# Patient Record
Sex: Male | Born: 1945
Health system: Southern US, Community
[De-identification: ages and names within clinical notes are randomized; demographics above are authoritative.]

## PROBLEM LIST (undated history)

## (undated) DIAGNOSIS — K858 Other acute pancreatitis without necrosis or infection: Secondary | ICD-10-CM

## (undated) DIAGNOSIS — E7800 Pure hypercholesterolemia, unspecified: Secondary | ICD-10-CM

## (undated) DIAGNOSIS — I700 Atherosclerosis of aorta: Secondary | ICD-10-CM

## (undated) DIAGNOSIS — I10 Essential (primary) hypertension: Secondary | ICD-10-CM

## (undated) DIAGNOSIS — N529 Male erectile dysfunction, unspecified: Secondary | ICD-10-CM

## (undated) DIAGNOSIS — M1990 Unspecified osteoarthritis, unspecified site: Secondary | ICD-10-CM

## (undated) DIAGNOSIS — I2510 Atherosclerotic heart disease of native coronary artery without angina pectoris: Secondary | ICD-10-CM

## (undated) DIAGNOSIS — R7303 Prediabetes: Secondary | ICD-10-CM

## (undated) HISTORY — DX: Atherosclerotic heart disease of native coronary artery without angina pectoris: I25.10

## (undated) HISTORY — DX: Other acute pancreatitis without necrosis or infection: K85.80

## (undated) HISTORY — DX: Prediabetes: R73.03

## (undated) HISTORY — DX: Essential (primary) hypertension: I10

## (undated) HISTORY — DX: Atherosclerosis of aorta: I70.0

## (undated) HISTORY — DX: Pure hypercholesterolemia, unspecified: E78.00

## (undated) HISTORY — DX: Male erectile dysfunction, unspecified: N52.9

## (undated) HISTORY — PX: HERNIA REPAIR: SHX51

---

## 2015-07-15 DIAGNOSIS — E782 Mixed hyperlipidemia: Secondary | ICD-10-CM | POA: Diagnosis not present

## 2015-07-15 DIAGNOSIS — Z0000 Encounter for general adult medical examination without abnormal findings: Secondary | ICD-10-CM | POA: Diagnosis not present

## 2015-07-15 DIAGNOSIS — R7301 Impaired fasting glucose: Secondary | ICD-10-CM | POA: Diagnosis not present

## 2015-07-15 DIAGNOSIS — I10 Essential (primary) hypertension: Secondary | ICD-10-CM | POA: Diagnosis not present

## 2015-07-19 DIAGNOSIS — Z1211 Encounter for screening for malignant neoplasm of colon: Secondary | ICD-10-CM | POA: Diagnosis not present

## 2015-09-02 DIAGNOSIS — R740 Nonspecific elevation of levels of transaminase and lactic acid dehydrogenase [LDH]: Secondary | ICD-10-CM | POA: Diagnosis not present

## 2015-09-14 DIAGNOSIS — R945 Abnormal results of liver function studies: Secondary | ICD-10-CM | POA: Diagnosis not present

## 2016-01-21 DIAGNOSIS — E782 Mixed hyperlipidemia: Secondary | ICD-10-CM | POA: Diagnosis not present

## 2016-01-21 DIAGNOSIS — R7303 Prediabetes: Secondary | ICD-10-CM | POA: Diagnosis not present

## 2016-01-21 DIAGNOSIS — I10 Essential (primary) hypertension: Secondary | ICD-10-CM | POA: Diagnosis not present

## 2016-02-28 DIAGNOSIS — Z01818 Encounter for other preprocedural examination: Secondary | ICD-10-CM | POA: Diagnosis not present

## 2016-03-30 DIAGNOSIS — Z86010 Personal history of colonic polyps: Secondary | ICD-10-CM | POA: Diagnosis not present

## 2016-03-30 DIAGNOSIS — K648 Other hemorrhoids: Secondary | ICD-10-CM | POA: Diagnosis not present

## 2016-03-30 DIAGNOSIS — Z79899 Other long term (current) drug therapy: Secondary | ICD-10-CM | POA: Diagnosis not present

## 2016-03-30 DIAGNOSIS — Z1211 Encounter for screening for malignant neoplasm of colon: Secondary | ICD-10-CM | POA: Diagnosis not present

## 2016-03-30 DIAGNOSIS — D124 Benign neoplasm of descending colon: Secondary | ICD-10-CM | POA: Diagnosis not present

## 2016-03-30 DIAGNOSIS — Z7982 Long term (current) use of aspirin: Secondary | ICD-10-CM | POA: Diagnosis not present

## 2016-03-30 DIAGNOSIS — I10 Essential (primary) hypertension: Secondary | ICD-10-CM | POA: Diagnosis not present

## 2016-03-30 DIAGNOSIS — Z800 Family history of malignant neoplasm of digestive organs: Secondary | ICD-10-CM | POA: Diagnosis not present

## 2016-04-03 DIAGNOSIS — J018 Other acute sinusitis: Secondary | ICD-10-CM | POA: Diagnosis not present

## 2016-08-15 DIAGNOSIS — Z0000 Encounter for general adult medical examination without abnormal findings: Secondary | ICD-10-CM | POA: Diagnosis not present

## 2016-08-15 DIAGNOSIS — Z125 Encounter for screening for malignant neoplasm of prostate: Secondary | ICD-10-CM | POA: Diagnosis not present

## 2016-08-15 DIAGNOSIS — Z23 Encounter for immunization: Secondary | ICD-10-CM | POA: Diagnosis not present

## 2016-08-15 DIAGNOSIS — Z6826 Body mass index (BMI) 26.0-26.9, adult: Secondary | ICD-10-CM | POA: Diagnosis not present

## 2016-12-06 DIAGNOSIS — M25512 Pain in left shoulder: Secondary | ICD-10-CM | POA: Diagnosis not present

## 2016-12-06 DIAGNOSIS — J018 Other acute sinusitis: Secondary | ICD-10-CM | POA: Diagnosis not present

## 2017-02-13 DIAGNOSIS — I10 Essential (primary) hypertension: Secondary | ICD-10-CM | POA: Diagnosis not present

## 2017-02-13 DIAGNOSIS — E782 Mixed hyperlipidemia: Secondary | ICD-10-CM | POA: Diagnosis not present

## 2017-02-13 DIAGNOSIS — R7303 Prediabetes: Secondary | ICD-10-CM | POA: Diagnosis not present

## 2017-03-27 DIAGNOSIS — J060 Acute laryngopharyngitis: Secondary | ICD-10-CM | POA: Diagnosis not present

## 2017-07-04 DIAGNOSIS — J060 Acute laryngopharyngitis: Secondary | ICD-10-CM | POA: Diagnosis not present

## 2017-08-20 DIAGNOSIS — R7303 Prediabetes: Secondary | ICD-10-CM | POA: Diagnosis not present

## 2017-08-20 DIAGNOSIS — Z6826 Body mass index (BMI) 26.0-26.9, adult: Secondary | ICD-10-CM | POA: Diagnosis not present

## 2017-08-20 DIAGNOSIS — E782 Mixed hyperlipidemia: Secondary | ICD-10-CM | POA: Diagnosis not present

## 2017-08-20 DIAGNOSIS — Z0000 Encounter for general adult medical examination without abnormal findings: Secondary | ICD-10-CM | POA: Diagnosis not present

## 2017-08-20 DIAGNOSIS — I10 Essential (primary) hypertension: Secondary | ICD-10-CM | POA: Diagnosis not present

## 2017-08-20 DIAGNOSIS — Z125 Encounter for screening for malignant neoplasm of prostate: Secondary | ICD-10-CM | POA: Diagnosis not present

## 2017-09-12 DIAGNOSIS — I10 Essential (primary) hypertension: Secondary | ICD-10-CM | POA: Diagnosis not present

## 2017-09-12 DIAGNOSIS — M7711 Lateral epicondylitis, right elbow: Secondary | ICD-10-CM | POA: Diagnosis not present

## 2018-01-07 DIAGNOSIS — J018 Other acute sinusitis: Secondary | ICD-10-CM | POA: Diagnosis not present

## 2018-05-16 DIAGNOSIS — R7303 Prediabetes: Secondary | ICD-10-CM | POA: Diagnosis not present

## 2018-05-16 DIAGNOSIS — E782 Mixed hyperlipidemia: Secondary | ICD-10-CM | POA: Diagnosis not present

## 2018-05-16 DIAGNOSIS — I10 Essential (primary) hypertension: Secondary | ICD-10-CM | POA: Diagnosis not present

## 2018-09-26 DIAGNOSIS — Z0000 Encounter for general adult medical examination without abnormal findings: Secondary | ICD-10-CM | POA: Diagnosis not present

## 2018-09-26 DIAGNOSIS — I10 Essential (primary) hypertension: Secondary | ICD-10-CM | POA: Diagnosis not present

## 2018-09-26 DIAGNOSIS — R7303 Prediabetes: Secondary | ICD-10-CM | POA: Diagnosis not present

## 2018-09-26 DIAGNOSIS — E782 Mixed hyperlipidemia: Secondary | ICD-10-CM | POA: Diagnosis not present

## 2018-10-02 DIAGNOSIS — Z6827 Body mass index (BMI) 27.0-27.9, adult: Secondary | ICD-10-CM | POA: Diagnosis not present

## 2018-10-02 DIAGNOSIS — Z0000 Encounter for general adult medical examination without abnormal findings: Secondary | ICD-10-CM | POA: Diagnosis not present

## 2018-11-05 DIAGNOSIS — Z23 Encounter for immunization: Secondary | ICD-10-CM | POA: Diagnosis not present

## 2018-11-05 DIAGNOSIS — Z7289 Other problems related to lifestyle: Secondary | ICD-10-CM | POA: Diagnosis not present

## 2018-11-05 DIAGNOSIS — E785 Hyperlipidemia, unspecified: Secondary | ICD-10-CM | POA: Diagnosis not present

## 2018-11-05 DIAGNOSIS — R739 Hyperglycemia, unspecified: Secondary | ICD-10-CM | POA: Diagnosis not present

## 2018-11-05 DIAGNOSIS — K5900 Constipation, unspecified: Secondary | ICD-10-CM | POA: Diagnosis not present

## 2018-11-05 DIAGNOSIS — K852 Alcohol induced acute pancreatitis without necrosis or infection: Secondary | ICD-10-CM | POA: Diagnosis not present

## 2018-11-05 DIAGNOSIS — R188 Other ascites: Secondary | ICD-10-CM | POA: Diagnosis not present

## 2018-11-05 DIAGNOSIS — K863 Pseudocyst of pancreas: Secondary | ICD-10-CM | POA: Diagnosis not present

## 2018-11-05 DIAGNOSIS — R1110 Vomiting, unspecified: Secondary | ICD-10-CM | POA: Diagnosis not present

## 2018-11-05 DIAGNOSIS — R10813 Right lower quadrant abdominal tenderness: Secondary | ICD-10-CM | POA: Diagnosis not present

## 2018-11-05 DIAGNOSIS — E876 Hypokalemia: Secondary | ICD-10-CM | POA: Diagnosis not present

## 2018-11-05 DIAGNOSIS — R109 Unspecified abdominal pain: Secondary | ICD-10-CM | POA: Diagnosis not present

## 2018-11-05 DIAGNOSIS — Z7982 Long term (current) use of aspirin: Secondary | ICD-10-CM | POA: Diagnosis not present

## 2018-11-05 DIAGNOSIS — Z79899 Other long term (current) drug therapy: Secondary | ICD-10-CM | POA: Diagnosis not present

## 2018-11-05 DIAGNOSIS — M109 Gout, unspecified: Secondary | ICD-10-CM | POA: Diagnosis not present

## 2018-11-05 DIAGNOSIS — R1031 Right lower quadrant pain: Secondary | ICD-10-CM | POA: Diagnosis not present

## 2018-11-05 DIAGNOSIS — I10 Essential (primary) hypertension: Secondary | ICD-10-CM | POA: Diagnosis not present

## 2018-11-05 DIAGNOSIS — K567 Ileus, unspecified: Secondary | ICD-10-CM | POA: Diagnosis not present

## 2018-11-18 DIAGNOSIS — K858 Other acute pancreatitis without necrosis or infection: Secondary | ICD-10-CM | POA: Diagnosis not present

## 2018-11-18 DIAGNOSIS — M87051 Idiopathic aseptic necrosis of right femur: Secondary | ICD-10-CM | POA: Diagnosis not present

## 2018-11-18 DIAGNOSIS — M87052 Idiopathic aseptic necrosis of left femur: Secondary | ICD-10-CM | POA: Diagnosis not present

## 2018-12-11 ENCOUNTER — Ambulatory Visit: Payer: BC Managed Care – PPO | Admitting: Orthopaedic Surgery

## 2018-12-11 ENCOUNTER — Other Ambulatory Visit: Payer: Self-pay

## 2018-12-11 ENCOUNTER — Encounter: Payer: Self-pay | Admitting: Orthopaedic Surgery

## 2018-12-11 ENCOUNTER — Ambulatory Visit: Payer: Self-pay

## 2018-12-11 VITALS — Ht 69.0 in | Wt 170.0 lb

## 2018-12-11 DIAGNOSIS — M87051 Idiopathic aseptic necrosis of right femur: Secondary | ICD-10-CM | POA: Diagnosis not present

## 2018-12-11 DIAGNOSIS — M87052 Idiopathic aseptic necrosis of left femur: Secondary | ICD-10-CM | POA: Diagnosis not present

## 2018-12-11 DIAGNOSIS — M25552 Pain in left hip: Secondary | ICD-10-CM

## 2018-12-11 HISTORY — DX: Idiopathic aseptic necrosis of right femur: M87.051

## 2018-12-11 NOTE — Progress Notes (Signed)
Office Visit Note   Patient: Austin Atkins           Date of Birth: 1945-05-20           MRN: [id number] Visit Date: 12/11/2018              Requested by: No referring provider defined for this encounter. PCP: [facility]   Assessment & Plan: Visit Diagnoses:  1. Pain in left hip   2. Avascular necrosis of bones of both hips (HCC)     Plan: Asymptomatic aseptic necrosis both hips.  Long discussion with Austin Atkins regarding the above.  I would not suggest any specific treatment at this point as he does not have any functional limitations or significant pain.  Have discussed hip replacement as definitive treatment at some point in the future.  Etiology might be from his history of alcohol  Follow-Up Instructions: Return if symptoms worsen or fail to improve.   Orders:  Orders Placed This Encounter  Procedures  . XR Pelvis 1-2 Views   No orders of the defined types were placed in this encounter.     Procedures: No procedures performed   Clinical Data: No additional findings.   Subjective: Chief Complaint  Patient presents with  . Left Hip - Pain  . Right Hip - Pain  Patient presents today to have both his hip evaluated. He was hospitalized for pancreatitis for first week of November. He was at [facility]. While doing imaging they noticed his hip joints were deteriorating. He was advised to follow up for both hips. He has no hip pain today or difficulty getting around. He said that after sitting for a prolonged period it takes a couple steps before he is able to take off.  No related groin pain or anterior thigh pain.  No limitation of activities or sleep. Austin Atkins relates that he was diagnosed with avascular necrosis after further questioning he did have a history of increased alcohol ingestion but has not had any in several years  HPI  Review of Systems   Objective: Vital Signs: Ht 5\' 9"  (1.753 m)   Wt 170 lb (77.1 kg)   BMI 25.10 kg/m    Physical Exam Constitutional:      Appearance: He is well-developed.  Eyes:     Pupils: Pupils are equal, round, and reactive to light.  Pulmonary:     Effort: Pulmonary effort is normal.  Skin:    General: Skin is warm and dry.  Neurological:     Mental Status: He is alert and oriented to person, place, and time.  Psychiatric:        Behavior: Behavior normal.     Ortho Exam awake alert and oriented x3.  Comfortable sitting.  Walks without a limp.  Painless range of motion of both hips.  Symmetrical range of motion of both hips with some mild loss of internal rotation laterally.  No pain with flexion.  Straight leg raise negative.  Skin intact.  Motor and sensory exam intact  Specialty Comments:  No specialty comments available.  Imaging: Xr Pelvis 1-2 Views  Result Date: 12/11/2018 AP pelvis was obtained demonstrating avascular necrosis of both hips a little more extensive on the right than the left.  There is no obvious collapse.  The hip joints appear to be well-maintained.  No prior films for comparison    PMFS History: Patient Active Problem List   Diagnosis Date Noted  . Avascular necrosis of bones  of both hips (HCC) 12/11/2018   Past Medical History:  Diagnosis Date  . High blood pressure   . High cholesterol     History reviewed. No pertinent family history.  Past Surgical History:  Procedure Laterality Date  . HERNIA REPAIR     Social History   Occupational History  . Not on file  Tobacco Use  . Smoking status: Former Games developer  . Smokeless tobacco: Never Used  Substance and Sexual Activity  . Alcohol use: Never    Frequency: Never  . Drug use: Never  . Sexual activity: Not on file

## 2018-12-24 DIAGNOSIS — R799 Abnormal finding of blood chemistry, unspecified: Secondary | ICD-10-CM | POA: Diagnosis not present

## 2019-02-24 ENCOUNTER — Other Ambulatory Visit: Payer: Self-pay

## 2019-02-24 MED ORDER — VALSARTAN-HYDROCHLOROTHIAZIDE 160-25 MG PO TABS: 1.0000 | ORAL_TABLET | Freq: Every day | ORAL | 0 refills | Status: DC

## 2019-02-24 MED ORDER — SIMVASTATIN 10 MG PO TABS: 10.0000 mg | ORAL_TABLET | Freq: Every day | ORAL | 0 refills | Status: DC

## 2019-02-24 MED ORDER — MELOXICAM 7.5 MG PO TABS: 7.5000 mg | ORAL_TABLET | Freq: Two times a day (BID) | ORAL | 0 refills | Status: DC

## 2019-03-10 ENCOUNTER — Encounter: Payer: Self-pay | Admitting: Family Medicine

## 2019-03-10 ENCOUNTER — Other Ambulatory Visit: Payer: Self-pay

## 2019-03-10 ENCOUNTER — Ambulatory Visit (INDEPENDENT_AMBULATORY_CARE_PROVIDER_SITE_OTHER): Payer: Medicare PPO | Admitting: Family Medicine

## 2019-03-10 VITALS — BP 120/78 | HR 99 | Temp 94.5°F | Ht 69.0 in | Wt 177.2 lb

## 2019-03-10 DIAGNOSIS — N400 Enlarged prostate without lower urinary tract symptoms: Secondary | ICD-10-CM | POA: Insufficient documentation

## 2019-03-10 DIAGNOSIS — I10 Essential (primary) hypertension: Secondary | ICD-10-CM | POA: Insufficient documentation

## 2019-03-10 DIAGNOSIS — R7301 Impaired fasting glucose: Secondary | ICD-10-CM | POA: Diagnosis not present

## 2019-03-10 DIAGNOSIS — E782 Mixed hyperlipidemia: Secondary | ICD-10-CM | POA: Diagnosis not present

## 2019-03-10 DIAGNOSIS — I700 Atherosclerosis of aorta: Secondary | ICD-10-CM

## 2019-03-10 DIAGNOSIS — K861 Other chronic pancreatitis: Secondary | ICD-10-CM | POA: Diagnosis not present

## 2019-03-10 DIAGNOSIS — N401 Enlarged prostate with lower urinary tract symptoms: Secondary | ICD-10-CM

## 2019-03-10 NOTE — Patient Instructions (Signed)
BPH (benign prostatic hyperplasia) Recommend try sal palmetto.  Impaired fasting glucose Well controlled.  No changes to medicines.  Continue to work on eating a healthy diet and exercise.  Labs drawn today.   Essential hypertension, benign Well controlled.  No changes to medicines.  Continue to work on eating a healthy diet and exercise.  Labs drawn today.   Atherosclerosis of aorta (HCC) Continue low fat diet and simvastatin.  Mixed hyperlipidemia Well controlled.  No changes to medicines.  Continue to work on eating a healthy diet and exercise.  Labs drawn today.   Other chronic pancreatitis (HCC) Check labs. Continue to abstain from alcohol.  Recommend continued low fat diet and exercise.

## 2019-03-10 NOTE — Assessment & Plan Note (Signed)
Recommend try sal palmetto.

## 2019-03-10 NOTE — Assessment & Plan Note (Signed)
Well controlled.  ?No changes to medicines.  ?Continue to work on eating a healthy diet and exercise.  ?Labs drawn today.  ?

## 2019-03-10 NOTE — Progress Notes (Signed)
Subjective:  Patient ID: Austin Atkins, male    DOB: 1945-06-01  Age: 74 y.o. MRN: [id number]  Chief Complaint  Patient presents with  . Hyperlipidemia  . Hypertension    HPI Pt presents with hyperlipidemia.  Date of diagnosis 2012.  Current treatment includes Zocor.  Compliance with treatment has been good; he maintains his low cholesterol diet, follows up as directed, and maintains his exercise regimen.  He denies experiencing any hypercholesterolemia related symptoms.  Concurrent health problems include hypertension.      Pt presents for follow up of hypertension.  Date of diagnosis 02/2010.  His current cardiac medication regimen includes a diuretic ( HCTZ 25 mg QD ) and an angiotensin receptor blocker ( Diovan ).  He is tolerating medications without side effects; specifically, he denies cough, dizziness, edema, fatigue and headache.  Compliance with treatment has been good; he takes his medication as directed, maintains his diet and exercise regimen, and follows up as directed.  Concurrent health problems include hyperlipidemia.      Austin Atkins presents with a diagnosis of prediabetes.  This was diagnosed 2012.  Symptoms are relieved with diet and exercise.    Recurrent chronic pancreatitis which has recurred over the last 2 years. Patient used to drink alcohol to excess, but has cut down significantly.  His last flare up was in 11/2018. He denies abdominal pain. Social History   Socioeconomic History  . Marital status: Married    Spouse name: Not on file  . Number of children: 1  . Years of education: Not on file  . Highest education level: Not on file  Occupational History  . Occupation: Engineer, site: [place]  Tobacco Use  . Smoking status: Former Smoker    Types: Cigarettes  . Smokeless tobacco: Never Used  Substance and Sexual Activity  . Alcohol use: Yes    Alcohol/week: 2.0 standard drinks    Types: 1 Cans of beer, 1 Glasses of wine per week  .  Drug use: Never  . Sexual activity: Not on file  Other Topics Concern  . Not on file  Social History Narrative  . Not on file   Social Determinants of Health   Financial Resource Strain:   . Difficulty of Paying Living Expenses: Not on file  Food Insecurity:   . Worried About Charity fundraiser in the Last Year: Not on file  . Ran Out of Food in the Last Year: Not on file  Transportation Needs:   . Lack of Transportation (Medical): Not on file  . Lack of Transportation (Non-Medical): Not on file  Physical Activity:   . Days of Exercise per Week: Not on file  . Minutes of Exercise per Session: Not on file  Stress:   . Feeling of Stress : Not on file  Social Connections:   . Frequency of Communication with Friends and Family: Not on file  . Frequency of Social Gatherings with Friends and Family: Not on file  . Attends Religious Services: Not on file  . Active Member of Clubs or Organizations: Not on file  . Attends Archivist Meetings: Not on file  . Marital Status: Not on file   Past Medical History:  Diagnosis Date  . Atherosclerosis of aorta ([place])   . CAD (coronary artery disease)   . Erectile dysfunction   . High blood pressure   . High cholesterol   . Other acute pancreatitis without necrosis or infection   .  Prediabetes    Family History  Problem Relation Age of Onset  . Cancer Mother   . Heart failure Father   . Kidney Stones Father   . Hypertension Other   . Cancer Other     Review of Systems  Constitutional: Negative for chills, fatigue and fever.  HENT: Negative for congestion, ear pain and sore throat.   Respiratory: Negative for cough and shortness of breath.   Cardiovascular: Negative for chest pain.  Gastrointestinal: Negative for abdominal pain, constipation, diarrhea, nausea and vomiting.  Endocrine: Negative for polydipsia, polyphagia and polyuria.  Genitourinary: Negative for dysuria and frequency.       Nocturia. Able to go back to  sleep.   Musculoskeletal: Negative for arthralgias and myalgias.  Neurological: Negative for dizziness and headaches.  Psychiatric/Behavioral: Negative for dysphoric mood. The patient is not nervous/anxious.        No dysphoria     Objective:  BP 120/78 (BP Location: Right Arm, Patient Position: Sitting)   Pulse 99   Temp (!) 94.5 F (34.7 C) (Tympanic)   Ht 5\' 9"  (1.753 m)   Wt 177 lb 3.2 oz (80.4 kg)   SpO2 (!) 82%   BMI 26.17 kg/m   BP/Weight 03/10/2019 12/11/2018  Systolic BP 120 -  Diastolic BP 78 -  Wt. (Lbs) 177.2 170  BMI 26.17 25.1    Physical Exam Vitals reviewed.  Constitutional:      Appearance: Normal appearance.  Neck:     Vascular: No carotid bruit.  Cardiovascular:     Rate and Rhythm: Normal rate and regular rhythm.     Pulses: Normal pulses.     Heart sounds: Normal heart sounds.  Pulmonary:     Effort: Pulmonary effort is normal.     Breath sounds: Normal breath sounds. No wheezing, rhonchi or rales.  Abdominal:     General: Bowel sounds are normal.     Palpations: Abdomen is soft.     Tenderness: There is no abdominal tenderness.  Neurological:     Mental Status: He is alert.  Psychiatric:        Mood and Affect: Mood normal.        Behavior: Behavior normal.    Diabetic Foot Exam - Simple   No data filed      No results found for: WBC, HGB, HCT, PLT, GLUCOSE, CHOL, TRIG, HDL, LDLDIRECT, LDLCALC, ALT, AST, NA, K, CL, CREATININE, BUN, CO2, TSH, PSA, INR, GLUF, HGBA1C, MICROALBUR    Assessment & Plan:  BPH (benign prostatic hyperplasia) Recommend try sal palmetto.  Impaired fasting glucose Well controlled.  No changes to medicines.  Continue to work on eating a healthy diet and exercise.  Labs drawn today.   Essential hypertension, benign Well controlled.  No changes to medicines.  Continue to work on eating a healthy diet and exercise.  Labs drawn today.   Atherosclerosis of aorta (HCC) Continue low fat diet and  simvastatin.  Mixed hyperlipidemia Well controlled.  No changes to medicines.  Continue to work on eating a healthy diet and exercise.  Labs drawn today.   Other chronic pancreatitis (HCC) Check labs. Continue to abstain from alcohol.  Recommend continued low fat diet and exercise.      Follow-up: Return in about 6 months (around 09/10/2019) for fasting. Please schedule his AWV with Nurse 11/10/2019..  AVS was given to patient prior to departure.  Selena Batten [facility] [phone]

## 2019-03-10 NOTE — Assessment & Plan Note (Signed)
Check labs. Continue to abstain from alcohol.  Recommend continued low fat diet and exercise.

## 2019-03-10 NOTE — Assessment & Plan Note (Addendum)
Well controlled.  ?No changes to medicines.  ?Continue to work on eating a healthy diet and exercise.  ?Labs drawn today.  ?

## 2019-03-10 NOTE — Assessment & Plan Note (Signed)
Continue low fat diet and simvastatin.

## 2019-03-12 LAB — CBC WITH DIFFERENTIAL/PLATELET
Basophils Absolute: 0 10*3/uL (ref 0.0–0.2)
Basos: 0 %
EOS (ABSOLUTE): 0.1 10*3/uL (ref 0.0–0.4)
Eos: 1 %
Hematocrit: 46.5 % (ref 37.5–51.0)
Hemoglobin: 15.7 g/dL (ref 13.0–17.7)
Immature Grans (Abs): 0 10*3/uL (ref 0.0–0.1)
Immature Granulocytes: 0 %
Lymphocytes Absolute: 1.6 10*3/uL (ref 0.7–3.1)
Lymphs: 22 %
MCH: 29.4 pg (ref 26.6–33.0)
MCHC: 33.8 g/dL (ref 31.5–35.7)
MCV: 87 fL (ref 79–97)
Monocytes Absolute: 0.5 10*3/uL (ref 0.1–0.9)
Monocytes: 7 %
Neutrophils Absolute: 5 10*3/uL (ref 1.4–7.0)
Neutrophils: 70 %
Platelets: 263 10*3/uL (ref 150–450)
RBC: 5.34 x10E6/uL (ref 4.14–5.80)
RDW: 12.6 % (ref 11.6–15.4)
WBC: 7.2 10*3/uL (ref 3.4–10.8)

## 2019-03-12 LAB — LIPID PANEL
Chol/HDL Ratio: 2.8 ratio (ref 0.0–5.0)
Cholesterol, Total: 148 mg/dL (ref 100–199)
HDL: 52 mg/dL (ref >39)
LDL Chol Calc (NIH): 83 mg/dL (ref 0–99)
Triglycerides: 63 mg/dL (ref 0–149)
VLDL Cholesterol Cal: 13 mg/dL (ref 5–40)

## 2019-03-12 LAB — COMP. METABOLIC PANEL (12)
AST: 20 IU/L (ref 0–40)
Albumin/Globulin Ratio: 2 (ref 1.2–2.2)
Albumin: 4.2 g/dL (ref 3.7–4.7)
Alkaline Phosphatase: 102 IU/L (ref 39–117)
BUN/Creatinine Ratio: 20 (ref 10–24)
BUN: 17 mg/dL (ref 8–27)
Bilirubin Total: 0.5 mg/dL (ref 0.0–1.2)
Calcium: 9.7 mg/dL (ref 8.6–10.2)
Chloride: 101 mmol/L (ref 96–106)
Creatinine, Ser: 0.84 mg/dL (ref 0.76–1.27)
GFR calc Af Amer: 102 mL/min/{1.73_m2} (ref >59)
GFR calc non Af Amer: 88 mL/min/{1.73_m2} (ref >59)
Globulin, Total: 2.1 g/dL (ref 1.5–4.5)
Glucose: 107 mg/dL — ABNORMAL HIGH (ref 65–99)
Potassium: 4.3 mmol/L (ref 3.5–5.2)
Sodium: 141 mmol/L (ref 134–144)
Total Protein: 6.3 g/dL (ref 6.0–8.5)

## 2019-03-12 LAB — HEMOGLOBIN A1C
Est. average glucose Bld gHb Est-mCnc: 126 mg/dL
Hgb A1c MFr Bld: 6 % — ABNORMAL HIGH (ref 4.8–5.6)

## 2019-03-12 LAB — CARDIOVASCULAR RISK ASSESSMENT

## 2019-03-12 LAB — TSH: TSH: 2.68 u[IU]/mL (ref 0.450–4.500)

## 2019-03-12 LAB — LIPASE: Lipase: 26 U/L (ref 13–78)

## 2019-03-12 LAB — AMYLASE: Amylase: 41 U/L (ref 31–110)

## 2019-04-28 NOTE — Progress Notes (Signed)
Subjective:  Patient ID: Austin Atkins, male    DOB: [birth year]  Age: 74 y.o. MRN: [id number]  Chief Complaint  Patient presents with  . Arm Pain    left    HPI 74 yo white male who presents with left forearm pain (tennis elbow pain) No recurrent mechanical strain. No injuries. Similar pain has occurred in the right arm and has responded to injection. He has tried biofreeze spray. No help.    Past Medical History:  Diagnosis Date  . Atherosclerosis of aorta ([place])   . CAD (coronary artery disease)   . Erectile dysfunction   . High blood pressure   . High cholesterol   . Other acute pancreatitis without necrosis or infection   . Prediabetes    Past Surgical History:  Procedure Laterality Date  . HERNIA REPAIR      Family History  Problem Relation Age of Onset  . Cancer Mother   . Heart failure Father   . Kidney Stones Father   . Hypertension Other   . Cancer Other    Social History   Socioeconomic History  . Marital status: Married    Spouse name: Not on file  . Number of children: 1  . Years of education: Not on file  . Highest education level: Not on file  Occupational History  . 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 Smoking status: Former Smoker    Types: Cigarettes  . Smokeless tobacco: Never Used  Substance and Sexual Activity  . Alcohol use: Yes    Alcohol/week: 2.0 standard drinks    Types: 1 Cans of beer, 1 Glasses of wine per week  . Drug use: Never  . Sexual activity: Not on file  Other Topics Concern  . Not on file  Social History Narrative  . Not on file   Social Determinants of Health   Financial Resource Strain:   . Difficulty of Paying Living Expenses:   Food Insecurity:   . Worried About Charity fundraiser in the Last Year:   . Arboriculturist in the Last Year:   Transportation Needs:   . Film/video editor (Medical):   Marland Kitchen Lack of Transportation (Non-Medical):   Physical Activity:   . Days  of Exercise per Week:   . Minutes of Exercise per Session:   Stress:   . Feeling of Stress :   Social Connections:   . Frequency of Communication with Friends and Family:   . Frequency of Social Gatherings with Friends and Family:   . Attends Religious Services:   . Active Member of Clubs or Organizations:   . Attends Archivist Meetings:   Marland Kitchen Marital Status:     Review of Systems  Constitutional: Negative for chills, fatigue and fever.  HENT: Negative for congestion, ear pain and sore throat.   Respiratory: Negative for cough and shortness of breath.   Cardiovascular: Negative for chest pain and leg swelling.  Gastrointestinal: Negative for abdominal pain, constipation, diarrhea, nausea and vomiting.  Genitourinary: Negative for dysuria and frequency.  Musculoskeletal: Negative for arthralgias and myalgias.  Neurological: Negative for dizziness and headaches.  Psychiatric/Behavioral: Negative for dysphoric mood. The patient is not nervous/anxious.      Objective:  BP 116/64   Pulse 72   Temp 97.7 F (36.5 C)   Resp 16   Ht 5\' 9"  (1.753 m)   Wt 182 lb 9.6 oz (82.8 kg)  BMI 26.97 kg/m   BP/Weight 04/29/2019 03/10/2019 12/11/2018  Systolic BP 116 120 -  Diastolic BP 64 78 -  Wt. (Lbs) 182.6 177.2 170  BMI 26.97 26.17 25.1    Physical Exam Constitutional:      Appearance: Normal appearance.  Cardiovascular:     Rate and Rhythm: Normal rate and regular rhythm.     Heart sounds: Normal heart sounds.  Pulmonary:     Effort: Pulmonary effort is normal.     Breath sounds: Normal breath sounds.  Musculoskeletal:        General: Tenderness (left lateral epicondyle. ) present.  Neurological:     Mental Status: He is alert and oriented to person, place, and time.  Psychiatric:        Mood and Affect: Mood normal.        Behavior: Behavior normal.     Lab Results  Component Value Date   WBC 7.2 03/10/2019   HGB 15.7 03/10/2019   HCT 46.5 03/10/2019   PLT  263 03/10/2019   GLUCOSE 107 (H) 03/10/2019   CHOL 148 03/10/2019   TRIG 63 03/10/2019   HDL 52 03/10/2019   LDLCALC 83 03/10/2019   AST 20 03/10/2019   NA 141 03/10/2019   K 4.3 03/10/2019   CL 101 03/10/2019   CREATININE 0.84 03/10/2019   BUN 17 03/10/2019   TSH 2.680 03/10/2019   HGBA1C 6.0 (H) 03/10/2019      Assessment & Plan:  1. Left lateral epicondylitis Risks were discussed including bleeding, infection, increase in sugars if diabetic, atrophy at site of injection, and increased pain.  After consent was obtained, using sterile technique the left lateral epidondyle was prepped with betadine and alcohol. Kenalog 40 mg and 5 ml plain Lidocaine was then injected and the needle withdrawn.  The procedure was well tolerated. The patient is asked to continue to rest the joint for 24 hours before resuming regular activities.  It may be more painful for the first 1-2 days.  Watch for fever, or increased swelling or persistent pain in the joint. Call or return to clinic prn if such symptoms occur or there is failure to improve as anticipated.  Recommend try aleve or ibuprofen. I also recommended an elbow band.   Follow-up: Return if symptoms worsen or fail to improve.  An After Visit Summary was printed and given to the patient.  Blane Ohara [facility] [phone]

## 2019-04-29 ENCOUNTER — Ambulatory Visit: Payer: Medicare PPO | Admitting: Family Medicine

## 2019-04-29 ENCOUNTER — Other Ambulatory Visit: Payer: Self-pay

## 2019-04-29 ENCOUNTER — Encounter: Payer: Self-pay | Admitting: Family Medicine

## 2019-04-29 VITALS — BP 116/64 | HR 72 | Temp 97.7°F | Resp 16 | Ht 69.0 in | Wt 182.6 lb

## 2019-04-29 DIAGNOSIS — M7712 Lateral epicondylitis, left elbow: Secondary | ICD-10-CM | POA: Diagnosis not present

## 2019-04-29 NOTE — Patient Instructions (Addendum)
May try aleve or ibuprofen if it helps.    Tennis Elbow Tennis elbow is swelling (inflammation) in your outer forearm, near your elbow. Swelling affects the tissues that connect muscle to bone (tendons). Tennis elbow can happen in any sport or job in which you use your elbow too much. It is caused by doing the same motion over and over. Tennis elbow can cause:  Pain and tenderness in your forearm and the outer part of your elbow. You may have pain all the time, or only when using the arm.  A burning feeling. This runs from your elbow through your arm.  Weak grip in your hand. Follow these instructions at home: Activity  Rest your elbow and wrist. Avoid activities that cause problems, as told by your doctor.  If told by your doctor, wear an elbow strap to reduce stress on the area.  Do physical therapy exercises as told.  If you lift an object, lift it with your palm facing up. This is easier on your elbow. Lifestyle  If your tennis elbow is caused by sports, check your equipment and make sure that: ? You are using it correctly. ? It fits you well.  If your tennis elbow is caused by work or by using a computer, take breaks often to stretch your arm. Talk with your manager about how you can manage your condition at work. If you have a brace:  Wear the brace as told by your doctor. Remove it only as told by your doctor.  Loosen the brace if your fingers tingle, get numb, or turn cold and blue.  Keep the brace clean.  If the brace is not waterproof, ask your doctor if you may take the brace off for bathing. If you must keep the brace on while bathing: ? Do not let it get wet. ? Cover it with a watertight covering when you take a bath or a shower. General instructions   If told, put ice on the painful area: ? Put ice in a plastic bag. ? Place a towel between your skin and the bag. ? Leave the ice on for 20 minutes, 2-3 times a day.  Take over-the-counter and prescription  medicines only as told by your doctor.  Keep all follow-up visits as told by your doctor. This is important. Contact a doctor if:  Your pain does not get better with treatment.  Your pain gets worse.  You have weakness in your forearm, hand, or fingers.  You cannot feel your forearm, hand, or fingers. Summary  Tennis elbow is swelling (inflammation) in your outer forearm, near your elbow.  Tennis elbow is caused by doing the same motion over and over.  Rest your elbow and wrist. Avoid activities that cause problems, as told by your doctor.  If told, put ice on the painful area for 20 minutes, 2-3 times a day. This information is not intended to replace advice given to you by your health care provider. Make sure you discuss any questions you have with your health care provider. Document Revised: 09/14/2017 Document Reviewed: 10/03/2016 Elsevier Patient Education  2020 [employer].

## 2019-05-04 ENCOUNTER — Encounter: Payer: Self-pay | Admitting: Family Medicine

## 2019-05-04 DIAGNOSIS — M7712 Lateral epicondylitis, left elbow: Secondary | ICD-10-CM | POA: Insufficient documentation

## 2019-05-04 MED ORDER — TRIAMCINOLONE ACETONIDE 40 MG/ML IJ SUSP: 40.0000 mg | Freq: Once | INTRAMUSCULAR | Status: DC

## 2019-05-27 DIAGNOSIS — G8929 Other chronic pain: Secondary | ICD-10-CM | POA: Diagnosis not present

## 2019-05-27 DIAGNOSIS — Z791 Long term (current) use of non-steroidal anti-inflammatories (NSAID): Secondary | ICD-10-CM | POA: Diagnosis not present

## 2019-05-27 DIAGNOSIS — Z7982 Long term (current) use of aspirin: Secondary | ICD-10-CM | POA: Diagnosis not present

## 2019-05-27 DIAGNOSIS — Z87891 Personal history of nicotine dependence: Secondary | ICD-10-CM | POA: Diagnosis not present

## 2019-05-27 DIAGNOSIS — I951 Orthostatic hypotension: Secondary | ICD-10-CM | POA: Diagnosis not present

## 2019-05-27 DIAGNOSIS — M1990 Unspecified osteoarthritis, unspecified site: Secondary | ICD-10-CM | POA: Diagnosis not present

## 2019-05-27 DIAGNOSIS — I10 Essential (primary) hypertension: Secondary | ICD-10-CM | POA: Diagnosis not present

## 2019-05-27 DIAGNOSIS — Z8249 Family history of ischemic heart disease and other diseases of the circulatory system: Secondary | ICD-10-CM | POA: Diagnosis not present

## 2019-05-27 DIAGNOSIS — E785 Hyperlipidemia, unspecified: Secondary | ICD-10-CM | POA: Diagnosis not present

## 2019-07-10 ENCOUNTER — Other Ambulatory Visit: Payer: Self-pay | Admitting: Family Medicine

## 2019-08-06 ENCOUNTER — Telehealth: Payer: Self-pay | Admitting: Family Medicine

## 2019-08-06 NOTE — Progress Notes (Signed)
°  Chronic Care Management   Outreach Note  08/06/2019 Name: DIAGO HAIK MRN: [id number] DOB: May 26, 1945  Referred by: Blane Ohara, MD Reason for referral : No chief complaint on file.   An unsuccessful telephone outreach was attempted today. The patient was referred to the pharmacist for assistance with care management and care coordination.   Follow Up Plan:   Lynnae January [facility] Scheduler

## 2019-08-14 ENCOUNTER — Telehealth: Payer: Self-pay | Admitting: Family Medicine

## 2019-08-14 NOTE — Progress Notes (Signed)
°  Chronic Care Management   Outreach Note  08/14/2019 Name: EMIDIO WARRELL MRN: [id number] DOB: 1945/06/29  Referred by: Blane Ohara, MD Reason for referral : No chief complaint on file.   An unsuccessful telephone outreach was attempted today. The patient was referred to the pharmacist for assistance with care management and care coordination.   Follow Up Plan:   Lynnae January [facility] Scheduler

## 2019-08-15 ENCOUNTER — Telehealth: Payer: Self-pay | Admitting: Family Medicine

## 2019-08-15 NOTE — Progress Notes (Signed)
°  Chronic Care Management   Note  08/15/2019 Name: Austin Atkins MRN: [id number] DOB: 1945-02-03  Austin Atkins is a 74 y.o. year old male who is a primary care patient of [facility], Kirsten, MD. I reached out to [name] by phone today in response to a referral sent by Austin Atkins's PCP, [facility], Kirsten, MD.   Austin Atkins was given information about Chronic Care Management services today including:  1. CCM service includes personalized support from designated clinical staff supervised by his physician, including individualized plan of care and coordination with other care providers 2. 24/7 contact phone numbers for assistance for urgent and routine care needs. 3. Service will only be billed when office clinical staff spend 20 minutes or more in a month to coordinate care. 4. Only one practitioner may furnish and bill the service in a calendar month. 5. The patient may stop CCM services at any time (effective at the end of the month) by phone call to the office staff.   Patient agreed to services and verbal consent obtained.   Follow up plan:   Lynnae January [facility] Scheduler

## 2019-09-10 NOTE — Chronic Care Management (AMB) (Signed)
Chronic Care Management Pharmacy  Name: Austin Atkins  MRN: [id number] DOB: 26-Oct-1945  Chief Complaint/ HPI  Austin Atkins,  74 y.o. , male presents for their Initial CCM visit with the clinical pharmacist via telephone due to COVID-19 Pandemic.  PCP : Austin Brome, MD  Their chronic conditions include: hypertension, atherosclerosis of aorta, chronic pancreatitis, impaired fasting glucose, BPH, hyperlipidemia, prediabetes.   Office Visits: 04/29/2019 - left forearm pain. Kenalog injection in office.  Consult Visit: None in chart  Medications: Outpatient Encounter Medications as of 09/17/2019  Medication Sig  . Ascorbic Acid (VITAMIN C) 1000 MG tablet Take 1,000 mg by mouth daily.  Marland Kitchen aspirin EC 81 MG tablet Take 81 mg by mouth daily.  . meloxicam (MOBIC) 7.5 MG tablet TAKE 1 TABLET (7.5 MG TOTAL) BY MOUTH IN THE MORNING AND AT BEDTIME.  . Multiple Vitamin (MULTIVITAMIN) capsule Take 1 capsule by mouth daily.  . Omega-3 Fatty Acids (FISH OIL) 1000 MG CAPS Take by mouth daily.   . simvastatin (ZOCOR) 10 MG tablet TAKE 1 TABLET (10 MG TOTAL) BY MOUTH AT BEDTIME.  . valsartan-hydrochlorothiazide (DIOVAN-HCT) 160-25 MG tablet TAKE 1 TABLET EVERY DAY   Facility-Administered Encounter Medications as of 09/17/2019  Medication  . triamcinolone acetonide (KENALOG-40) injection 40 mg   No Known Allergies  SDOH Screenings   Alcohol Screen:   . Last Alcohol Screening Score (AUDIT): Not on file  Depression (PHQ2-9): Low Risk   . PHQ-2 Score: 0  Financial Resource Strain:   . Difficulty of Paying Living Expenses: Not on file  Food Insecurity: No Food Insecurity  . Worried About Charity fundraiser in the Last Year: Never true  . Ran Out of Food in the Last Year: Never true  Housing: Low Risk   . Last Housing Risk Score: 0  Physical Activity:   . Days of Exercise per Week: Not on file  . Minutes of Exercise per Session: Not on file  Social Connections:   . Frequency of  Communication with Friends and Family: Not on file  . Frequency of Social Gatherings with Friends and Family: Not on file  . Attends Religious Services: Not on file  . Active Member of Clubs or Organizations: Not on file  . Attends Archivist Meetings: Not on file  . Marital Status: Not on file  Stress:   . Feeling of Stress : Not on file  Tobacco Use: Medium Risk  . Smoking Tobacco Use: Former Smoker  . Smokeless Tobacco Use: Never Used  Transportation Needs: No Transportation Needs  . Lack of Transportation (Medical): No  . Lack of Transportation (Non-Medical): No    Current Diagnosis/Assessment:  Goals Addressed            This Visit's Progress   . Pharmacy Care Plan       CARE PLAN ENTRY (see longitudinal plan of care for additional care plan information)  Current Barriers:  . Chronic Disease Management support, education, and care coordination needs related to Hypertension, Hyperlipidemia, and Diabetes   Hypertension BP Readings from Last 3 Encounters:  04/29/19 116/64  03/10/19 120/78   . Pharmacist Clinical Goal(s): o Over the next 90 days, patient will work with PharmD and providers to maintain BP goal <130/80 . Current regimen:  o Valsartan-hydrochlorothiazide 160-25 mg daily . Interventions: o Reviewed medications and home blood pressure readings.  o Encouraged to keep up the good work with healthy diet and exercise.  . Patient self care activities -  Over the next 90 days, patient will: o Check BP weekly, document, and provide at future appointments o Ensure daily salt intake < 2300 mg/day  Hyperlipidemia Lab Results  Component Value Date/Time   LDLCALC 83 03/10/2019 10:00 AM   . Pharmacist Clinical Goal(s): o Over the next 90 days, patient will work with PharmD and providers to maintain LDL goal < 100  . Current regimen:  . Simvastatin 10 mg daily at bedtime . Omega 3 fish oil 1000 mg daily . Interventions: o Reviewed medications and  lifestyle modifications. Encouraged patient to keep up the good work.  o Coordinated patient scheduling annual physical with Dr. Tobie Atkins.  o Requested refills sent to Austin Atkins until next visit.  . Patient self care activities - Over the next 90 days, patient will: o Schedule a visit with Dr. Tobie Atkins for updated labs and physical.   Predabetes Lab Results  Component Value Date/Time   HGBA1C 6.0 (H) 03/10/2019 10:00 AM   . Pharmacist Clinical Goal(s): o Over the next 90 days, patient will work with PharmD and providers to maintain A1c goal <6.5% . Current regimen:  o Diet and Lifestyle . Interventions: o Reviewed home blood sugar readings and lifestyle.  . Patient self care activities - Over the next 90 days, patient will: o Continue the good work with healthy diet and exercise.  Medication management . Pharmacist Clinical Goal(s): o Over the next 90 days, patient will work with PharmD and providers to maintain optimal medication adherence . Current pharmacy: [facility]  . Interventions o Comprehensive medication review performed. o Continue current medication management strategy . Patient self care activities - Over the next 90 days, patient will: o Focus on medication adherence by continuing to take each day.  o Take medications as prescribed o Report any questions or concerns to PharmD and/or provider(s)  Initial goal documentation        Prediabetes   Recent Relevant Labs: Lab Results  Component Value Date/Time   HGBA1C 6.0 (H) 03/10/2019 10:00 AM     Checking BG: Weekly  Recent FBG Readings: ~118 (lowest in the last year number 89 mg/dL and highest in the last year 152)  Patient has failed these meds in past: none reported Patient is currently controlled on the following medications:   Diet and Lifestyle    We discussed: diet and exercise extensively. Checks blood pressure, blood sugar and weight weekly. He records this on a pad.   Plan  Continue current  medications and  Hypertension   BP today is:  <130/80  Office blood pressures are  BP Readings from Last 3 Encounters:  04/29/19 116/64  03/10/19 120/78   Kidney Function Lab Results  Component Value Date/Time   CREATININE 0.84 03/10/2019 10:00 AM   GFRNONAA 88 03/10/2019 10:00 AM   GFRAA 102 03/10/2019 10:00 AM   K 4.3 03/10/2019 10:00 AM    Patient has failed these meds in the past: none reported Patient is currently controlled on the following medications:   Valsartan-hydrochlorothiazide 160-25 mg daily  Patient checks BP at home weekly  Patient home BP readings are ranging: 105/78 mmHg  We discussed diet and exercise extensively   Exercise: daily on elliptical at home for 45 minutes. [place] the beach when he's there.   Diet: Eats a lot of chicken and [place]. Eats salads. Does not eat a lot of red meat. Eats greens and vegetables. Likes potato chips and french fries but does not make a regular habit.  Plan  Continue current medications   Hyperlipidemia   LDL goal < 100  Lipid Panel     Component Value Date/Time   CHOL 148 03/10/2019 1000   TRIG 63 03/10/2019 1000   HDL 52 03/10/2019 1000   LDLCALC 83 03/10/2019 1000    Hepatic Function Latest Ref Rng & Units 03/10/2019  Total Protein 6.0 - 8.5 g/dL 6.3  Albumin 3.7 - 4.7 g/dL 4.2  AST 0 - 40 IU/L 20  Alk Phosphatase 39 - 117 IU/L 102  Total Bilirubin 0.0 - 1.2 mg/dL 0.5     The 10-year ASCVD risk score Mikey Bussing DC Jr., et al., 2013) is: 16.3%   Values used to calculate the score:     Age: 41 years     Sex: Male     Is Non-Hispanic African American: No     Diabetic: No     Tobacco smoker: No     Systolic Blood Pressure: 761 mmHg     Is BP treated: Yes     HDL Cholesterol: 52 mg/dL     Total Cholesterol: 148 mg/dL   Patient has failed these meds in past: none reported Patient is currently controlled on the following medications:  . Simvastatin 10 mg daily at bedtime . Omega 3 fish oil 1000 mg  daily  We discussed:  diet and exercise extensively  Plan  Continue current medications  Health Maintenance   Patient is currently controlled on the following medications:  Marland Kitchen Vitamin C 1000 mg daily - supplementation . Meloxicam 7.5 mg bid - arthritis/pain . Multivitamin daily - supplementation  We discussed:  Patient denies missed doses and medication regimen is affordable.   Plan  Continue current medications  Vaccines   Reviewed and discussed patient's vaccination history. Had Pneumonia booster at [facility] per patient.  COVID vaccine has had both of Moderna doses (second shot in late February). Would like to get shot in office October - pharmacist to provide Shelle Iron with information for scheduling.   Immunization History  Administered Date(s) Administered  . Fluad Quad(high Dose 65+) 09/11/2018  . Influenza-Unspecified 10/02/2017  . Pneumococcal Conjugate-13 08/15/2016  . Pneumococcal Polysaccharide-23 12/07/2010  . Td 12/07/2010  . Zoster Recombinat (Shingrix) 09/11/2018, 11/20/2018    Plan  Recommended patient receive annual flu vaccine in office.   Medication Management   Pt uses [facility] for all medications Uses pill box? Puts a 2 week supply in container. Very regimented taking medication.  Pt endorses 100% compliance  We discussed: Patient denies missed doses.   Plan  Continue current medication management strategy    Follow up: 12 month phone visit

## 2019-09-17 ENCOUNTER — Other Ambulatory Visit: Payer: Self-pay

## 2019-09-17 ENCOUNTER — Ambulatory Visit: Payer: Medicare PPO

## 2019-09-17 DIAGNOSIS — I10 Essential (primary) hypertension: Secondary | ICD-10-CM

## 2019-09-17 DIAGNOSIS — E782 Mixed hyperlipidemia: Secondary | ICD-10-CM

## 2019-09-17 DIAGNOSIS — R7301 Impaired fasting glucose: Secondary | ICD-10-CM

## 2019-09-17 MED ORDER — SIMVASTATIN 10 MG PO TABS: 10.0000 mg | ORAL_TABLET | Freq: Every day | ORAL | 0 refills | Status: DC

## 2019-09-17 MED ORDER — MELOXICAM 7.5 MG PO TABS: 7.5000 mg | ORAL_TABLET | Freq: Two times a day (BID) | ORAL | 0 refills | Status: DC

## 2019-09-17 MED ORDER — VALSARTAN-HYDROCHLOROTHIAZIDE 160-25 MG PO TABS: 1.0000 | ORAL_TABLET | Freq: Every day | ORAL | 0 refills | Status: DC

## 2019-09-17 NOTE — Patient Instructions (Addendum)
Visit Information  Thank you for your time discussing your medications. I look forward to working with you to achieve your health care goals. Below is a summary of what we talked about during our visit.   Goals Addressed            This Visit's Progress   . Pharmacy Care Plan       CARE PLAN ENTRY (see longitudinal plan of care for additional care plan information)  Current Barriers:  . Chronic Disease Management support, education, and care coordination needs related to Hypertension, Hyperlipidemia, and Diabetes   Hypertension BP Readings from Last 3 Encounters:  04/29/19 116/64  03/10/19 120/78   . Pharmacist Clinical Goal(s): o Over the next 90 days, patient will work with PharmD and providers to maintain BP goal <130/80 . Current regimen:  o Valsartan-hydrochlorothiazide 160-25 mg daily . Interventions: o Reviewed medications and home blood pressure readings.  o Encouraged to keep up the good work with healthy diet and exercise.  . Patient self care activities - Over the next 90 days, patient will: o Check BP weekly, document, and provide at future appointments o Ensure daily salt intake < 2300 mg/day  Hyperlipidemia Lab Results  Component Value Date/Time   LDLCALC 83 03/10/2019 10:00 AM   . Pharmacist Clinical Goal(s): o Over the next 90 days, patient will work with PharmD and providers to maintain LDL goal < 100  . Current regimen:  . Simvastatin 10 mg daily at bedtime . Omega 3 fish oil 1000 mg daily . Interventions: o Reviewed medications and lifestyle modifications. Encouraged patient to keep up the good work.  o Coordinated patient scheduling annual physical with Dr. Sedalia Muta.  o Requested refills sent to [facility] until next visit.  . Patient self care activities - Over the next 90 days, patient will: o Schedule a visit with Dr. Sedalia Muta for updated labs and physical.   Predabetes Lab Results  Component Value Date/Time   HGBA1C 6.0 (H) 03/10/2019 10:00 AM    . Pharmacist Clinical Goal(s): o Over the next 90 days, patient will work with PharmD and providers to maintain A1c goal <6.5% . Current regimen:  o Diet and Lifestyle . Interventions: o Reviewed home blood sugar readings and lifestyle.  . Patient self care activities - Over the next 90 days, patient will: o Continue the good work with healthy diet and exercise.  Medication management . Pharmacist Clinical Goal(s): o Over the next 90 days, patient will work with PharmD and providers to maintain optimal medication adherence . Current pharmacy: [facility]  . Interventions o Comprehensive medication review performed. o Continue current medication management strategy . Patient self care activities - Over the next 90 days, patient will: o Focus on medication adherence by continuing to take each day.  o Take medications as prescribed o Report any questions or concerns to PharmD and/or provider(s)  Initial goal documentation        Austin Atkins was given information about Chronic Care Management services today including:  1. CCM service includes personalized support from designated clinical staff supervised by his physician, including individualized plan of care and coordination with other care providers 2. 24/7 contact phone numbers for assistance for urgent and routine care needs. 3. Standard insurance, coinsurance, copays and deductibles apply for chronic care management only during months in which we provide at least 20 minutes of these services. Most insurances cover these services at 100%, however patients may be responsible for any copay, coinsurance and/or deductible  if applicable. This service may help you avoid the need for more expensive face-to-face services. 4. Only one practitioner may furnish and bill the service in a calendar month. 5. The patient may stop CCM services at any time (effective at the end of the month) by phone call to the office staff.  Patient  agreed to services and verbal consent obtained.   The patient verbalized understanding of instructions provided today and agreed to receive a mailed copy of patient instruction and/or educational materials. Telephone follow up appointment with pharmacy team member scheduled for: 09/2020  Austin Atkins, PharmD Clinical Pharmacist [facility] [phone] (office) [phone] (mobile)  DASH Eating Plan DASH stands for "Dietary Approaches to Stop Hypertension." The DASH eating plan is a healthy eating plan that has been shown to reduce high blood pressure (hypertension). It may also reduce your risk for type 2 diabetes, heart disease, and stroke. The DASH eating plan may also help with weight loss. What are tips for following this plan?  General guidelines  Avoid eating more than 2,300 mg (milligrams) of salt (sodium) a day. If you have hypertension, you may need to reduce your sodium intake to 1,500 mg a day.  Limit alcohol intake to no more than 1 drink a day for nonpregnant women and 2 drinks a day for men. One drink equals 12 oz of beer, 5 oz of wine, or 1 oz of hard liquor.  Work with your health care provider to maintain a healthy body weight or to lose weight. Ask what an ideal weight is for you.  Get at least 30 minutes of exercise that causes your heart to beat faster (aerobic exercise) most days of the week. Activities may include walking, swimming, or biking.  Work with your health care provider or diet and nutrition specialist (dietitian) to adjust your eating plan to your individual calorie needs. Reading food labels   Check food labels for the amount of sodium per serving. Choose foods with less than 5 percent of the Daily Value of sodium. Generally, foods with less than 300 mg of sodium per serving fit into this eating plan.  To find whole grains, look for the word "whole" as the first word in the ingredient list. Shopping  Buy products labeled as  "low-sodium" or "no salt added."  Buy fresh foods. Avoid canned foods and premade or frozen meals. Cooking  Avoid adding salt when cooking. Use salt-free seasonings or herbs instead of table salt or sea salt. Check with your health care provider or pharmacist before using salt substitutes.  Do not fry foods. Cook foods using healthy methods such as baking, boiling, grilling, and broiling instead.  Cook with heart-healthy oils, such as olive, canola, soybean, or sunflower oil. Meal planning  Eat a balanced diet that includes: ? 5 or more servings of fruits and vegetables each day. At each meal, try to fill half of your plate with fruits and vegetables. ? Up to 6-8 servings of whole grains each day. ? Less than 6 oz of lean meat, poultry, or fish each day. A 3-oz serving of meat is about the same size as a deck of cards. One egg equals 1 oz. ? 2 servings of low-fat dairy each day. ? A serving of nuts, seeds, or beans 5 times each week. ? Heart-healthy fats. Healthy fats called Omega-3 fatty acids are found in foods such as flaxseeds and coldwater fish, like sardines, salmon, and mackerel.  Limit how much you eat of the following: ?  Canned or prepackaged foods. ? Food that is high in trans fat, such as fried foods. ? Food that is high in saturated fat, such as fatty meat. ? Sweets, desserts, sugary drinks, and other foods with added sugar. ? Full-fat dairy products.  Do not salt foods before eating.  Try to eat at least 2 vegetarian meals each week.  Eat more home-cooked food and less restaurant, buffet, and fast food.  When eating at a restaurant, ask that your food be prepared with less salt or no salt, if possible. What foods are recommended? The items listed may not be a complete list. Talk with your dietitian about what dietary choices are best for you. Grains Whole-grain or whole-wheat bread. Whole-grain or whole-wheat pasta. [place] rice. [place] Cobbatmeal. Quinoa. Bulgur. Whole-grain  and low-sodium cereals. Pita bread. Low-fat, low-sodium crackers. Whole-wheat flour tortillas. Vegetables Fresh or frozen vegetables (raw, steamed, roasted, or grilled). Low-sodium or reduced-sodium tomato and vegetable juice. Low-sodium or reduced-sodium tomato sauce and tomato paste. Low-sodium or reduced-sodium canned vegetables. Fruits All fresh, dried, or frozen fruit. Canned fruit in natural juice (without added sugar). Meat and other protein foods Skinless chicken or [place]. Ground chicken or [place]. Pork with fat trimmed off. Fish and seafood. Egg whites. Dried beans, peas, or lentils. Unsalted nuts, nut butters, and seeds. Unsalted canned beans. Lean cuts of beef with fat trimmed off. Low-sodium, lean deli meat. Dairy Low-fat (1%) or fat-free (skim) milk. Fat-free, low-fat, or reduced-fat cheeses. Nonfat, low-sodium ricotta or cottage cheese. Low-fat or nonfat yogurt. Low-fat, low-sodium cheese. Fats and oils Soft margarine without trans fats. Vegetable oil. Low-fat, reduced-fat, or light mayonnaise and salad dressings (reduced-sodium). Canola, safflower, olive, soybean, and sunflower oils. Avocado. Seasoning and other foods Herbs. Spices. Seasoning mixes without salt. Unsalted popcorn and pretzels. Fat-free sweets. What foods are not recommended? The items listed may not be a complete list. Talk with your dietitian about what dietary choices are best for you. Grains Baked goods made with fat, such as croissants, muffins, or some breads. Dry pasta or rice meal packs. Vegetables Creamed or fried vegetables. Vegetables in a cheese sauce. Regular canned vegetables (not low-sodium or reduced-sodium). Regular canned tomato sauce and paste (not low-sodium or reduced-sodium). Regular tomato and vegetable juice (not low-sodium or reduced-sodium). Rosita FirePickles. Olives. Fruits Canned fruit in a light or heavy syrup. Fried fruit. Fruit in cream or butter sauce. Meat and other protein foods Fatty cuts  of meat. Ribs. Fried meat. Tomasa BlaseBacon. Sausage. Bologna and other processed lunch meats. Salami. Fatback. Hotdogs. Bratwurst. Salted nuts and seeds. Canned beans with added salt. Canned or smoked fish. Whole eggs or egg yolks. Chicken or [place] with skin. Dairy Whole or 2% milk, cream, and half-and-half. Whole or full-fat cream cheese. Whole-fat or sweetened yogurt. Full-fat cheese. Nondairy creamers. Whipped toppings. Processed cheese and cheese spreads. Fats and oils Butter. Stick margarine. Lard. Shortening. Ghee. Bacon fat. Tropical oils, such as coconut, palm kernel, or palm oil. Seasoning and other foods Salted popcorn and pretzels. Onion salt, garlic salt, seasoned salt, table salt, and sea salt. Worcestershire sauce. Tartar sauce. Barbecue sauce. Teriyaki sauce. Soy sauce, including reduced-sodium. Steak sauce. Canned and packaged gravies. Fish sauce. Oyster sauce. Cocktail sauce. Horseradish that you find on the shelf. Ketchup. Mustard. Meat flavorings and tenderizers. Bouillon cubes. Hot sauce and Tabasco sauce. Premade or packaged marinades. Premade or packaged taco seasonings. Relishes. Regular salad dressings. Where to find more information:  National Heart, Lung, and Blood Institute: PopSteam.iswww.nhlbi.nih.gov  American Heart Association: www.heart.org Summary  The DASH eating plan is a healthy eating plan that has been shown to reduce high blood pressure (hypertension). It may also reduce your risk for type 2 diabetes, heart disease, and stroke.  With the DASH eating plan, you should limit salt (sodium) intake to 2,300 mg a day. If you have hypertension, you may need to reduce your sodium intake to 1,500 mg a day.  When on the DASH eating plan, aim to eat more fresh fruits and vegetables, whole grains, lean proteins, low-fat dairy, and heart-healthy fats.  Work with your health care provider or diet and nutrition specialist (dietitian) to adjust your eating plan to your individual calorie  needs. This information is not intended to replace advice given to you by your health care provider. Make sure you discuss any questions you have with your health care provider. Document Revised: 12/01/2016 Document Reviewed: 12/13/2015 Elsevier Patient Education  2020 [employer].

## 2019-10-06 ENCOUNTER — Ambulatory Visit (INDEPENDENT_AMBULATORY_CARE_PROVIDER_SITE_OTHER): Payer: Medicare PPO | Admitting: Family Medicine

## 2019-10-06 ENCOUNTER — Other Ambulatory Visit: Payer: Self-pay

## 2019-10-06 VITALS — BP 122/78 | HR 80 | Temp 97.8°F | Ht 69.0 in | Wt 172.0 lb

## 2019-10-06 DIAGNOSIS — I10 Essential (primary) hypertension: Secondary | ICD-10-CM

## 2019-10-06 DIAGNOSIS — R7301 Impaired fasting glucose: Secondary | ICD-10-CM

## 2019-10-06 DIAGNOSIS — Z23 Encounter for immunization: Secondary | ICD-10-CM

## 2019-10-06 DIAGNOSIS — Z125 Encounter for screening for malignant neoplasm of prostate: Secondary | ICD-10-CM

## 2019-10-06 DIAGNOSIS — E782 Mixed hyperlipidemia: Secondary | ICD-10-CM | POA: Diagnosis not present

## 2019-10-06 DIAGNOSIS — Z1159 Encounter for screening for other viral diseases: Secondary | ICD-10-CM

## 2019-10-06 LAB — POCT URINALYSIS DIP (CLINITEK)
Bilirubin, UA: NEGATIVE
Blood, UA: NEGATIVE
Glucose, UA: NEGATIVE mg/dL
Ketones, POC UA: NEGATIVE mg/dL
Leukocytes, UA: NEGATIVE
Nitrite, UA: NEGATIVE
POC PROTEIN,UA: NEGATIVE
Spec Grav, UA: 1.01 (ref 1.010–1.025)
Urobilinogen, UA: NEGATIVE E.U./dL — AB
pH, UA: 6 (ref 5.0–8.0)

## 2019-10-06 NOTE — Patient Instructions (Signed)
Consider YOGA for hamstring stretching or referral to PT to assist

## 2019-10-06 NOTE — Progress Notes (Signed)
Subjective:   Austin Atkins is a 74 y.o. male who presents for Medicare Annual/Subsequent preventive examination.  Review of Systems    Hamstring pain-intense pain-equal pain-started 1 month ago-walk 4 miles-flat ground elipical every morning. Retired 1 year ago. 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 Antique cars-hobby.       Objective:    Today's Vitals   10/06/19 0836  BP: 122/78  Pulse: 80  Temp: 97.8 F (36.6 C)  TempSrc: Temporal  Weight: 172 lb (78 kg)   Body mass index is 25.4 kg/m.  Advanced Directives 03/10/2019  Does Patient Have a Medical Advance Directive? Yes  Type of Paramedic of [place];Living will    Current Medications (verified) Outpatient Encounter Medications as of 10/06/2019  Medication Sig  . Ascorbic Acid (VITAMIN C) 1000 MG tablet Take 1,000 mg by mouth daily.  Marland Kitchen aspirin EC 81 MG tablet Take 81 mg by mouth daily.  . meloxicam (MOBIC) 7.5 MG tablet Take 1 tablet (7.5 mg total) by mouth in the morning and at bedtime.  . Multiple Vitamin (MULTIVITAMIN) capsule Take 1 capsule by mouth daily.  . Omega-3 Fatty Acids (FISH OIL) 1000 MG CAPS Take by mouth daily.   . simvastatin (ZOCOR) 10 MG tablet Take 1 tablet (10 mg total) by mouth at bedtime.  . valsartan-hydrochlorothiazide (DIOVAN-HCT) 160-25 MG tablet Take 1 tablet by mouth daily.  . [DISCONTINUED] triamcinolone acetonide (KENALOG-40) injection 40 mg    No facility-administered encounter medications on file as of 10/06/2019.    Allergies (verified) Patient has no known allergies.   History: Past Medical History:  Diagnosis Date  . Atherosclerosis of aorta ([place])   . CAD (coronary artery disease)   . Erectile dysfunction   . High blood pressure   . High cholesterol   . Other acute pancreatitis without necrosis or infection   . Prediabetes    Past Surgical History:  Procedure Laterality Date  . HERNIA REPAIR     Family History  Problem Relation Age of Onset  . Cancer  Mother   . Heart failure Father   . Kidney Stones Father   . Hypertension Other   . Cancer Other   [place] at the [place] Social History   Socioeconomic History  . Marital status: Married    Spouse name: Not on file  . Number of children: 1  . Years of education: Not on file  . Highest education level: Not on file  Occupational History  . Occupation: Engineer, site: [place]  Tobacco Use  . Smoking status: Former Smoker    Types: Cigarettes  . Smokeless tobacco: Never Used  Substance and Sexual Activity  . Alcohol use: Yes    Alcohol/week: 2.0 standard drinks    Types: 1 Cans of beer, 1 Glasses of wine per week  . Drug use: Never  . Sexual activity: Not on file  Other Topics Concern  . Not on file  Social History Narrative  . Not on file   Social Determinants of Health   Financial Resource Strain:   . Difficulty of Paying Living Expenses: Not on file  Food Insecurity: No Food Insecurity  . Worried About Charity fundraiser in the Last Year: Never true  . Ran Out of Food in the Last Year: Never true  Transportation Needs: No Transportation Needs  . Lack of Transportation (Medical): No  . Lack of Transportation (Non-Medical): No  Physical Activity:   . Days of Exercise per Week:  Not on file  . Minutes of Exercise per Session: Not on file  Stress:   . Feeling of Stress : Not on file  Social Connections:   . Frequency of Communication with Friends and Family: Not on file  . Frequency of Social Gatherings with Friends and Family: Not on file  . Attends Religious Services: Not on file  . Active Member of Clubs or Organizations: Not on file  . Attends Archivist Meetings: Not on file  . Marital Status: Not on file    Tobacco Counseling Quit in the past        Activities of Daily Living In your present state of health, do you have any difficulty performing the following activities: 10/06/2019  Hearing? N  Vision? N  Difficulty  concentrating or making decisions? N  Walking or climbing stairs? N  Dressing or bathing? N  Doing errands, shopping? N  Some recent data might be hidden    Patient Care Team: Rochel Brome, MD as PCP - General (Family Medicine) Burnice Logan, [facility] as Pharmacist (Pharmacist)    Assessment:   This is a routine wellness examination for Austin Atkins.  Hearing/Vision screen No hearing, glasses Dietary issues and exercise activities discussed:daily exercise    Goals    . Pharmacy Care Plan     CARE PLAN ENTRY (see longitudinal plan of care for additional care plan information)  Current Barriers:  . Chronic Disease Management support, education, and care coordination needs related to Hypertension, Hyperlipidemia, and Diabetes   Hypertension BP Readings from Last 3 Encounters:  04/29/19 116/64  03/10/19 120/78   . Pharmacist Clinical Goal(s): o Over the next 90 days, patient will work with PharmD and providers to maintain BP goal <130/80 . Current regimen:  o Valsartan-hydrochlorothiazide 160-25 mg daily . Interventions: o Reviewed medications and home blood pressure readings.  o Encouraged to keep up the good work with healthy diet and exercise.  . Patient self care activities - Over the next 90 days, patient will: o Check BP weekly, document, and provide at future appointments o Ensure daily salt intake < 2300 mg/day  Hyperlipidemia Lab Results  Component Value Date/Time   LDLCALC 83 03/10/2019 10:00 AM   . Pharmacist Clinical Goal(s): o Over the next 90 days, patient will work with PharmD and providers to maintain LDL goal < 100  . Current regimen:  . Simvastatin 10 mg daily at bedtime . Omega 3 fish oil 1000 mg daily . Interventions: o Reviewed medications and lifestyle modifications. Encouraged patient to keep up the good work.  o Coordinated patient scheduling annual physical with Dr. Tobie Poet.  o Requested refills sent to [facility] until next visit.  . Patient self care  activities - Over the next 90 days, patient will: o Schedule a visit with Dr. Tobie Poet for updated labs and physical.   Predabetes Lab Results  Component Value Date/Time   HGBA1C 6.0 (H) 03/10/2019 10:00 AM   . Pharmacist Clinical Goal(s): o Over the next 90 days, patient will work with PharmD and providers to maintain A1c goal <6.5% . Current regimen:  o Diet and Lifestyle . Interventions: o Reviewed home blood sugar readings and lifestyle.  . Patient self care activities - Over the next 90 days, patient will: o Continue the good work with healthy diet and exercise.  Medication management . Pharmacist Clinical Goal(s): o Over the next 90 days, patient will work with PharmD and providers to maintain optimal medication adherence . Current pharmacy: [facility]  Order  . Interventions o Comprehensive medication review performed. o Continue current medication management strategy . Patient self care activities - Over the next 90 days, patient will: o Focus on medication adherence by continuing to take each day.  o Take medications as prescribed o Report any questions or concerns to PharmD and/or provider(s)  Initial goal documentation       Depression Screen PHQ 2/9 Scores 03/10/2019  PHQ - 2 Score 0    Fall Risk Fall Risk  03/10/2019  Falls in the past year? 1  Number falls in past yr: 0  Injury with Fall? 0  Follow up Falls prevention discussed    Any stairs in or around the home? Yes-upstair exercise If so, are there any without handrails? Steps to get into home Home free of loose throw rugs in walkways, pet beds, electrical cords, etc? pt tries to keep items "put up" Adequate lighting in your home to reduce risk of falls? No dark areas  ASSISTIVE DEVICES UTILIZED TO PREVENT FALLS:  Life alert? no Use of a cane, walker or w/c? no Grab bars in the bathroom? yes Shower chair or bench in shower? Yes-bench Elevated toilet seat or a handicapped toilet? no   Cognitive  Function: no concerns        Immunizations Immunization History  Administered Date(s) Administered  . Fluad Quad(high Dose 65+) 09/11/2018  . Influenza-Unspecified 10/02/2017  . Moderna SARS-COVID-2 Vaccination 01/23/2019, 02/26/2019  . Pneumococcal Conjugate-13 08/15/2016  . Pneumococcal Polysaccharide-23 12/07/2010  . Td 12/07/2010  . Zoster Recombinat (Shingrix) 09/11/2018, 11/20/2018  Screening Tests shinges vaccines-2020 Health Maintenance  Topic Date Due  . Hepatitis C Screening  Never done  . COLONOSCOPY  Never done  . PNA vac Low Risk Adult (2 of 2 - PPSV23) 08/15/2017  . INFLUENZA VACCINE  08/03/2019  . TETANUS/TDAP  12/06/2020  . COVID-19 Vaccine  Completed    Health Maintenance  Health Maintenance Due  Topic Date Due  . Hepatitis C Screening  Never done  . COLONOSCOPY  Never done  . PNA vac Low Risk Adult (2 of 2 - PPSV23) 08/15/2017  . INFLUENZA VACCINE  08/03/2019     Lung Cancer Screening:  Quit over 15 years ago-Additional Screening: not needed  Hepatitis C Screening: today Vision Screening: Recommended annual ophthalmology exams for early detection of glaucoma and other disorders of the eye. Eye doctor last year Is the patient up to date with their annual eye exam? yes Who is the provider or what is the name of the office in which the patient attends annual eye exams? [place] [facility] If pt is not established with a provider, would they like to be referred to a provider to establish care? No-has provider  Dental Screening: Recommended annual dental exams for proper oral hygiene-pt sees dentist q 6 months  1. Mixed hyperlipidemia - Lipid Panel With LDL/HDL Ratio Continue simvastatin 2. Impaired fasting glucose - Hemoglobin A1c Watch carbs 3. Essential hypertension, benign - CMP14+EGFR - POCT URINALYSIS DIP (CLINITEK) Diovan/HCT -stable 4. Screening PSA (prostate specific antigen) - PSA  5. Encounter for hepatitis C screening test for  low risk patient - Hepatitis C Antibody    Plan:    I have personally reviewed and noted the following in the patient's chart:   . Medical and social history . Use of alcohol, tobacco or illicit drugs  . Current medications and supplements . Functional ability and status . Nutritional status . Physical activity . Advanced directives . List of  other physicians . Hospitalizations, surgeries, and ER visits in previous 12 months . Vitals . Screenings to include cognitive, depression, and falls . Referrals and appointments  In addition, I have reviewed and discussed with patient certain preventive protocols, quality metrics, and best practice recommendations. A written personalized care plan for preventive services as well as general preventive health recommendations were provided to patient.    Mertha Baars, MD   10/06/2019

## 2019-10-07 ENCOUNTER — Other Ambulatory Visit: Payer: Self-pay | Admitting: Family Medicine

## 2019-10-07 DIAGNOSIS — R17 Unspecified jaundice: Secondary | ICD-10-CM

## 2019-10-07 LAB — CMP14+EGFR
ALT: 32 IU/L (ref 0–44)
AST: 33 IU/L (ref 0–40)
Albumin/Globulin Ratio: 2.7 — ABNORMAL HIGH (ref 1.2–2.2)
Albumin: 4.6 g/dL (ref 3.7–4.7)
Alkaline Phosphatase: 107 IU/L (ref 44–121)
BUN/Creatinine Ratio: 16 (ref 10–24)
BUN: 13 mg/dL (ref 8–27)
Bilirubin Total: 1.3 mg/dL — ABNORMAL HIGH (ref 0.0–1.2)
CO2: 25 mmol/L (ref 20–29)
Calcium: 9.8 mg/dL (ref 8.6–10.2)
Chloride: 104 mmol/L (ref 96–106)
Creatinine, Ser: 0.82 mg/dL (ref 0.76–1.27)
GFR calc Af Amer: 103 mL/min/{1.73_m2} (ref >59)
GFR calc non Af Amer: 89 mL/min/{1.73_m2} (ref >59)
Globulin, Total: 1.7 g/dL (ref 1.5–4.5)
Glucose: 112 mg/dL — ABNORMAL HIGH (ref 65–99)
Potassium: 4.2 mmol/L (ref 3.5–5.2)
Sodium: 143 mmol/L (ref 134–144)
Total Protein: 6.3 g/dL (ref 6.0–8.5)

## 2019-10-07 LAB — LIPID PANEL WITH LDL/HDL RATIO
Cholesterol, Total: 141 mg/dL (ref 100–199)
HDL: 57 mg/dL (ref >39)
LDL Chol Calc (NIH): 71 mg/dL (ref 0–99)
LDL/HDL Ratio: 1.2 ratio (ref 0.0–3.6)
Triglycerides: 60 mg/dL (ref 0–149)
VLDL Cholesterol Cal: 13 mg/dL (ref 5–40)

## 2019-10-07 LAB — HEPATITIS C ANTIBODY: Hep C Virus Ab: <0.1 s/co ratio (ref 0.0–0.9)

## 2019-10-07 LAB — HEMOGLOBIN A1C
Est. average glucose Bld gHb Est-mCnc: 128 mg/dL
Hgb A1c MFr Bld: 6.1 % — ABNORMAL HIGH (ref 4.8–5.6)

## 2019-10-07 LAB — PSA: Prostate Specific Ag, Serum: 2.6 ng/mL (ref 0.0–4.0)

## 2019-10-07 LAB — CARDIOVASCULAR RISK ASSESSMENT

## 2019-11-04 ENCOUNTER — Ambulatory Visit (INDEPENDENT_AMBULATORY_CARE_PROVIDER_SITE_OTHER): Payer: Medicare PPO

## 2019-11-04 DIAGNOSIS — Z23 Encounter for immunization: Secondary | ICD-10-CM

## 2019-11-04 NOTE — Progress Notes (Signed)
   Covid-19 Vaccination Clinic  Name:  Austin Atkins    MRN: [id number] DOB: Dec 15, 1945  11/04/2019  Mr. [name] was observed post Covid-19 immunization for 15 minutes without incident. He was provided with Vaccine Information Sheet and instruction to access the V-Safe system.   Mr. [name] was instructed to call 911 with any severe reactions post vaccine: Marland Kitchen Difficulty breathing  . Swelling of face and throat  . A fast heartbeat  . A bad rash all over body  . Dizziness and weakness

## 2019-11-06 ENCOUNTER — Encounter: Payer: Self-pay | Admitting: Family Medicine

## 2019-11-10 ENCOUNTER — Ambulatory Visit: Payer: Medicare PPO | Admitting: Family Medicine

## 2019-11-12 ENCOUNTER — Other Ambulatory Visit: Payer: Self-pay

## 2019-11-12 ENCOUNTER — Ambulatory Visit (INDEPENDENT_AMBULATORY_CARE_PROVIDER_SITE_OTHER): Payer: Medicare PPO | Admitting: Family Medicine

## 2019-11-12 ENCOUNTER — Encounter: Payer: Self-pay | Admitting: Family Medicine

## 2019-11-12 VITALS — BP 116/70 | HR 68 | Temp 96.4°F | Resp 14 | Ht 69.0 in | Wt 186.0 lb

## 2019-11-12 DIAGNOSIS — E782 Mixed hyperlipidemia: Secondary | ICD-10-CM | POA: Diagnosis not present

## 2019-11-12 DIAGNOSIS — S76319A Strain of muscle, fascia and tendon of the posterior muscle group at thigh level, unspecified thigh, initial encounter: Secondary | ICD-10-CM

## 2019-11-12 DIAGNOSIS — M545 Low back pain, unspecified: Secondary | ICD-10-CM

## 2019-11-12 DIAGNOSIS — I10 Essential (primary) hypertension: Secondary | ICD-10-CM | POA: Diagnosis not present

## 2019-11-12 DIAGNOSIS — R7301 Impaired fasting glucose: Secondary | ICD-10-CM | POA: Diagnosis not present

## 2019-11-12 NOTE — Patient Instructions (Signed)
Increase meloxicam 7.5 mg 2 in am.

## 2019-11-12 NOTE — Progress Notes (Addendum)
Subjective:  Patient ID: Austin Atkins, male    DOB: [birth year]  Age: 74 y.o. MRN: [id number]  Chief Complaint  Patient presents with  . Muscle Pain    HPI Pt presents with hyperlipidemia.  Date of diagnosis 2012.  Current treatment includes Zocor.  Compliance with treatment has been good; he maintains his low cholesterol diet, follows up as directed, and maintains his exercise regimen.  He denies experiencing any hypercholesterolemia related symptoms.  Concurrent health problems include hypertension.      Pt presents for follow up of hypertension.  Date of diagnosis 02/2010.  His current cardiac medication regimen includes a diuretic ( HCTZ 25 mg QD ) and an angiotensin receptor blocker ( Diovan 160 mg ).  He is tolerating medications without side effects; specifically, he denies cough, dizziness, edema, fatigue and headache.  Compliance with treatment has been good; he takes his medication as directed, maintains his diet and exercise regimen, and follows up as directed.  Concurrent health problems include hyperlipidemia.      [name] presents with a diagnosis of prediabetes.  This was diagnosed 2012.  Symptoms are relieved with diet and exercise.    Recurrent chronic pancreatitis which has recurred  2 years ago.  Patient used to drink alcohol to excess, but has quit alcohol use x 1 yr.  Hiss last flare up was in 11/2018. He denies abdominal pain.  Lumbar back pain that radiates in to BL posterior upper thighs. No radiculopathy. No injury. Going on for about 2-3 months. Stable. Exercise helps, but if sits for periods of time back becomes stiff. Coughing or sneezing makes it worse. No abdominal pain. Takes meloxicam 7.5 mg once daily for his hand pain  Social History   Socioeconomic History  . Marital status: Married    Spouse name: Not on file  . Number of children: 1  . Years of education: Not on file  . Highest education level: Not on file  Occupational History  . Occupation: Administrator: [employer]  Tobacco Use  . Smoking status: Former Smoker    Types: Cigarettes  . Smokeless tobacco: Never Used  Substance and Sexual Activity  . Alcohol use: Yes    Alcohol/week: 2.0 standard drinks    Types: 1 Cans of beer, 1 Glasses of wine per week  . Drug use: Never  . Sexual activity: Not on file  Other Topics Concern  . Not on file  Social History Narrative  . Not on file   Social Determinants of Health   Financial Resource Strain:   . Difficulty of Paying Living Expenses: Not on file  Food Insecurity: No Food Insecurity  . Worried About Programme researcher, broadcasting/film/video in the Last Year: Never true  . Ran Out of Food in the Last Year: Never true  Transportation Needs: No Transportation Needs  . Lack of Transportation (Medical): No  . Lack of Transportation (Non-Medical): No  Physical Activity:   . Days of Exercise per Week: Not on file  . Minutes of Exercise per Session: Not on file  Stress:   . Feeling of Stress : Not on file  Social Connections:   . Frequency of Communication with Friends and Family: Not on file  . Frequency of Social Gatherings with Friends and Family: Not on file  . Attends Religious Services: Not on file  . Active Member of Clubs or Organizations: Not on file  . Attends Banker Meetings: Not on file  .  Marital Status: Not on file   Past Medical History:  Diagnosis Date  . Atherosclerosis of aorta ([facility])   . CAD (coronary artery disease)   . Erectile dysfunction   . High blood pressure   . High cholesterol   . Other acute pancreatitis without necrosis or infection   . Prediabetes    Family History  Problem Relation Age of Onset  . Cancer Mother   . Heart failure Father   . Kidney Stones Father   . Hypertension Other   . Cancer Other     Review of Systems  Constitutional: Negative for chills, fatigue and fever.  HENT: Negative for congestion, ear pain and sore throat.   Respiratory: Negative for cough and  shortness of breath.   Cardiovascular: Negative for chest pain.  Gastrointestinal: Negative for abdominal pain, constipation, diarrhea, nausea and vomiting.  Endocrine: Negative for polydipsia, polyphagia and polyuria.  Genitourinary: Negative for dysuria and frequency.       Nocturia. Able to go back to sleep.   Musculoskeletal: Positive for arthralgias, back pain and myalgias.  Neurological: Negative for dizziness and headaches.  Psychiatric/Behavioral: Negative for dysphoric mood. The patient is not nervous/anxious.        No dysphoria     Objective:  BP 116/70   Pulse 68   Temp (!) 96.4 F (35.8 C)   Resp 14   Ht 5\' 9"  (1.753 m)   Wt 186 lb (84.4 kg)   BMI 27.47 kg/m   BP/Weight 11/12/2019 10/06/2019 04/29/2019  Systolic BP 116 122 116  Diastolic BP 70 78 64  Wt. (Lbs) 186 172 182.6  BMI 27.47 25.4 26.97    Physical Exam Vitals reviewed.  Constitutional:      Appearance: Normal appearance.  Neck:     Vascular: No carotid bruit.  Cardiovascular:     Rate and Rhythm: Normal rate and regular rhythm.     Pulses: Normal pulses.     Heart sounds: Normal heart sounds.  Pulmonary:     Effort: Pulmonary effort is normal.     Breath sounds: Normal breath sounds. No wheezing, rhonchi or rales.  Abdominal:     General: Bowel sounds are normal.     Palpations: Abdomen is soft.     Tenderness: There is no abdominal tenderness.  Neurological:     Mental Status: He is alert.  Psychiatric:        Mood and Affect: Mood normal.        Behavior: Behavior normal.    Diabetic Foot Exam - Simple   No data filed      Lab Results  Component Value Date   WBC 7.2 03/10/2019   HGB 15.7 03/10/2019   HCT 46.5 03/10/2019   PLT 263 03/10/2019   GLUCOSE 112 (H) 10/06/2019   CHOL 141 10/06/2019   TRIG 60 10/06/2019   HDL 57 10/06/2019   LDLCALC 71 10/06/2019   ALT 32 10/06/2019   AST 33 10/06/2019   NA 143 10/06/2019   K 4.2 10/06/2019   CL 104 10/06/2019   CREATININE 0.82  10/06/2019   BUN 13 10/06/2019   CO2 25 10/06/2019   TSH 2.680 03/10/2019   HGBA1C 6.1 (H) 10/06/2019      Assessment & Plan:  1. Mixed hyperlipidemia The current medical regimen is effective;  continue present plan and medications. Recommend continue to work on eating healthy diet and exercise.  2. Impaired fasting glucose Recommend continue to work on eating healthy diet and  exercise.  3. Essential hypertension, benign The current medical regimen is effective;  continue present plan and medications. Recommend continue to work on eating healthy diet and exercise.  4. Hamstring strain, unspecified laterality, initial encounter - Ambulatory referral to Physical Therapy  5. Lumbar back pain Increase meloxicam 7.5 2 in am Pt to call back in 2-3 weeks if physical therapy is not helping and will get xrays. - Ambulatory referral to Physical Therapy  Time for visit: 30 minutes.   Follow-up: Return in about 6 months (around 05/11/2020) for fasting.  AVS was given to patient prior to departure.  Blane Ohara, MD [facility] [phone]

## 2019-11-24 DIAGNOSIS — M79652 Pain in left thigh: Secondary | ICD-10-CM | POA: Diagnosis not present

## 2019-11-24 DIAGNOSIS — M79651 Pain in right thigh: Secondary | ICD-10-CM | POA: Diagnosis not present

## 2019-11-24 DIAGNOSIS — M79604 Pain in right leg: Secondary | ICD-10-CM | POA: Diagnosis not present

## 2019-11-24 DIAGNOSIS — M79605 Pain in left leg: Secondary | ICD-10-CM | POA: Diagnosis not present

## 2019-11-28 DIAGNOSIS — R509 Fever, unspecified: Secondary | ICD-10-CM | POA: Diagnosis not present

## 2019-11-28 DIAGNOSIS — Z20828 Contact with and (suspected) exposure to other viral communicable diseases: Secondary | ICD-10-CM | POA: Diagnosis not present

## 2019-11-28 DIAGNOSIS — J029 Acute pharyngitis, unspecified: Secondary | ICD-10-CM | POA: Diagnosis not present

## 2019-11-28 DIAGNOSIS — J0100 Acute maxillary sinusitis, unspecified: Secondary | ICD-10-CM | POA: Diagnosis not present

## 2019-12-08 DIAGNOSIS — M79652 Pain in left thigh: Secondary | ICD-10-CM | POA: Diagnosis not present

## 2019-12-08 DIAGNOSIS — M79605 Pain in left leg: Secondary | ICD-10-CM | POA: Diagnosis not present

## 2019-12-08 DIAGNOSIS — M79651 Pain in right thigh: Secondary | ICD-10-CM | POA: Diagnosis not present

## 2019-12-08 DIAGNOSIS — M79604 Pain in right leg: Secondary | ICD-10-CM | POA: Diagnosis not present

## 2019-12-17 ENCOUNTER — Other Ambulatory Visit: Payer: Self-pay | Admitting: Physician Assistant

## 2019-12-18 NOTE — Telephone Encounter (Signed)
°

## 2019-12-22 DIAGNOSIS — M79604 Pain in right leg: Secondary | ICD-10-CM | POA: Diagnosis not present

## 2019-12-22 DIAGNOSIS — M79605 Pain in left leg: Secondary | ICD-10-CM | POA: Diagnosis not present

## 2019-12-22 DIAGNOSIS — M79652 Pain in left thigh: Secondary | ICD-10-CM | POA: Diagnosis not present

## 2019-12-22 DIAGNOSIS — M79651 Pain in right thigh: Secondary | ICD-10-CM | POA: Diagnosis not present

## 2020-01-06 DIAGNOSIS — M79651 Pain in right thigh: Secondary | ICD-10-CM | POA: Diagnosis not present

## 2020-01-06 DIAGNOSIS — M79605 Pain in left leg: Secondary | ICD-10-CM | POA: Diagnosis not present

## 2020-01-06 DIAGNOSIS — M79604 Pain in right leg: Secondary | ICD-10-CM | POA: Diagnosis not present

## 2020-01-06 DIAGNOSIS — M79652 Pain in left thigh: Secondary | ICD-10-CM | POA: Diagnosis not present

## 2020-01-12 DIAGNOSIS — M79605 Pain in left leg: Secondary | ICD-10-CM | POA: Diagnosis not present

## 2020-01-12 DIAGNOSIS — M79652 Pain in left thigh: Secondary | ICD-10-CM | POA: Diagnosis not present

## 2020-01-12 DIAGNOSIS — M79604 Pain in right leg: Secondary | ICD-10-CM | POA: Diagnosis not present

## 2020-01-12 DIAGNOSIS — M79651 Pain in right thigh: Secondary | ICD-10-CM | POA: Diagnosis not present

## 2020-01-20 DIAGNOSIS — M79604 Pain in right leg: Secondary | ICD-10-CM | POA: Diagnosis not present

## 2020-01-20 DIAGNOSIS — M79651 Pain in right thigh: Secondary | ICD-10-CM | POA: Diagnosis not present

## 2020-01-20 DIAGNOSIS — M79652 Pain in left thigh: Secondary | ICD-10-CM | POA: Diagnosis not present

## 2020-01-20 DIAGNOSIS — M79605 Pain in left leg: Secondary | ICD-10-CM | POA: Diagnosis not present

## 2020-01-26 DIAGNOSIS — M79652 Pain in left thigh: Secondary | ICD-10-CM | POA: Diagnosis not present

## 2020-01-26 DIAGNOSIS — M79651 Pain in right thigh: Secondary | ICD-10-CM | POA: Diagnosis not present

## 2020-01-26 DIAGNOSIS — M79605 Pain in left leg: Secondary | ICD-10-CM | POA: Diagnosis not present

## 2020-01-26 DIAGNOSIS — M79604 Pain in right leg: Secondary | ICD-10-CM | POA: Diagnosis not present

## 2020-02-12 ENCOUNTER — Telehealth: Payer: Self-pay

## 2020-02-12 NOTE — Chronic Care Management (AMB) (Signed)
° ° °  Chronic Care Management Pharmacy Assistant   Name: Austin Atkins  MRN: [id number] DOB: 05-26-1945  Reason for Encounter: Disease State call for general adherence   Patient Questions:  1.  Have you seen any other providers since your last visit? No  2.  Any changes in your medicines or health? No    PCP : Blane Ohara, MD  Allergies:  No Known Allergies  Medications: Outpatient Encounter Medications as of 02/12/2020  Medication Sig   Ascorbic Acid (VITAMIN C) 1000 MG tablet Take 1,000 mg by mouth daily.   aspirin EC 81 MG tablet Take 81 mg by mouth daily.   meloxicam (MOBIC) 7.5 MG tablet TAKE 1 TABLET IN THE MORNING AND AT BEDTIME.   Multiple Vitamin (MULTIVITAMIN) capsule Take 1 capsule by mouth daily.   Omega-3 Fatty Acids (FISH OIL) 1000 MG CAPS Take by mouth daily.    simvastatin (ZOCOR) 10 MG tablet TAKE 1 TABLET (10 MG TOTAL) BY MOUTH AT BEDTIME.   valsartan-hydrochlorothiazide (DIOVAN-HCT) 160-25 MG tablet TAKE 1 TABLET EVERY DAY   No facility-administered encounter medications on file as of 02/12/2020.    Current Diagnosis: Patient Active Problem List   Diagnosis Date Noted   Left lateral epicondylitis 05/04/2019   Other chronic pancreatitis (HCC) 03/10/2019   Mixed hyperlipidemia 03/10/2019   Essential hypertension, benign 03/10/2019   Impaired fasting glucose 03/10/2019   Atherosclerosis of aorta (HCC) 03/10/2019   BPH (benign prostatic hyperplasia) 03/10/2019   Avascular necrosis of bones of both hips (HCC) 12/11/2018   Patient stated he is doing well, he was [facility] way [facility].  He stated he checks his blood pressure weekly, it typically runs around 115/75.  He stated he checks his blood sugar weekly, it averages around 120, there are rare times when it will be 90.  He stated he has no symptoms when it got to the 90's.    Patient is taking his medication as directed, he is staying active.  He stated he has no side effects, and  has not issues getting medication from the pharmacy.    Follow-Up:  Pharmacist Review  Lucia Gaskins, CPP notified  Leilani Able, [facility] Clinical Pharmacist Assistant [phone]

## 2020-02-18 ENCOUNTER — Other Ambulatory Visit: Payer: Self-pay | Admitting: Family Medicine

## 2020-02-27 ENCOUNTER — Telehealth: Payer: Self-pay

## 2020-02-27 NOTE — Progress Notes (Signed)
    Chronic Care Management Pharmacy Assistant   Name: Austin Atkins  MRN: [id number] DOB: 1945/06/11  Reason for Encounter: Adherence   PCP : Blane Ohara, MD  Allergies:  No Known Allergies  Medications: Outpatient Encounter Medications as of 02/27/2020  Medication Sig  . Ascorbic Acid (VITAMIN C) 1000 MG tablet Take 1,000 mg by mouth daily.  Marland Kitchen aspirin EC 81 MG tablet Take 81 mg by mouth daily.  . meloxicam (MOBIC) 7.5 MG tablet TAKE 1 TABLET IN THE MORNING AND AT BEDTIME.  . Multiple Vitamin (MULTIVITAMIN) capsule Take 1 capsule by mouth daily.  . Omega-3 Fatty Acids (FISH OIL) 1000 MG CAPS Take by mouth daily.   . simvastatin (ZOCOR) 10 MG tablet TAKE 1 TABLET (10 MG TOTAL) BY MOUTH AT BEDTIME.  . valsartan-hydrochlorothiazide (DIOVAN-HCT) 160-25 MG tablet TAKE 1 TABLET EVERY DAY   No facility-administered encounter medications on file as of 02/27/2020.    Current Diagnosis: Patient Active Problem List   Diagnosis Date Noted  . Left lateral epicondylitis 05/04/2019  . Other chronic pancreatitis (HCC) 03/10/2019  . Mixed hyperlipidemia 03/10/2019  . Essential hypertension, benign 03/10/2019  . Impaired fasting glucose 03/10/2019  . Atherosclerosis of aorta (HCC) 03/10/2019  . BPH (benign prostatic hyperplasia) 03/10/2019  . Avascular necrosis of bones of both hips (HCC) 12/11/2018   Chart review noted there have been no recent changes with the patient.   November 2021, patient was having some physical therapy for back pain, meloxicam was increased to 7.5.  His next appointment with Lucia Gaskins, CPP is 09/14/20  Adherence gap- not available  Follow-Up:  Pharmacist Review  Lucia Gaskins, CPP  Leilani Able, [facility] Clinical Pharmacist Assistant [phone]

## 2020-03-22 ENCOUNTER — Encounter: Payer: Self-pay | Admitting: Legal Medicine

## 2020-03-22 ENCOUNTER — Telehealth (INDEPENDENT_AMBULATORY_CARE_PROVIDER_SITE_OTHER): Payer: Medicare PPO | Admitting: Legal Medicine

## 2020-03-22 VITALS — BP 132/90 | HR 80 | Temp 97.3°F | Ht 68.5 in | Wt 181.0 lb

## 2020-03-22 DIAGNOSIS — J018 Other acute sinusitis: Secondary | ICD-10-CM | POA: Diagnosis not present

## 2020-03-22 DIAGNOSIS — J329 Chronic sinusitis, unspecified: Secondary | ICD-10-CM | POA: Insufficient documentation

## 2020-03-22 MED ORDER — PREDNISONE 10 MG (21) PO TBPK: ORAL_TABLET | ORAL | 0 refills | Status: DC

## 2020-03-22 MED ORDER — CHERATUSSIN AC 100-10 MG/5ML PO SOLN: 5.0000 mL | Freq: Three times a day (TID) | ORAL | 0 refills | Status: DC | PRN

## 2020-03-22 MED ORDER — AMOXICILLIN 875 MG PO TABS: 875.0000 mg | ORAL_TABLET | Freq: Two times a day (BID) | ORAL | 0 refills | Status: DC

## 2020-03-22 NOTE — Progress Notes (Signed)
Virtual Visit via Telephone Note   This visit type was conducted due to national recommendations for restrictions regarding the COVID-19 Pandemic (e.g. social distancing) in an effort to limit this patient's exposure and mitigate transmission in our community.  Due to his co-morbid illnesses, this patient is at least at moderate risk for complications without adequate follow up.  This format is felt to be most appropriate for this patient at this time.  The patient did not have access to video technology/had technical difficulties with video requiring transitioning to audio format only (telephone).  All issues noted in this document were discussed and addressed.  No physical exam could be performed with this format.  Patient verbally consented to a telehealth visit.   Date:  03/22/2020   ID:  Austin Atkins, DOB 1945-03-05, MRN [id number]  Patient Location: Home Provider Location: Office/Clinic  PCP:  Blane Ohara, MD   Evaluation Performed:  New Patient Evaluation  Chief Complaint:  Sore throat, drainage  History of Present Illness:    Austin Atkins is a 75 y.o. male with Sore throat, drainage. No fever or chills, taking claritin.  The patient does not have symptoms concerning for COVID-19 infection (fever, chills, cough, or new shortness of breath).    Past Medical History:  Diagnosis Date  . Atherosclerosis of aorta (HCC)   . CAD (coronary artery disease)   . Erectile dysfunction   . High blood pressure   . High cholesterol   . Other acute pancreatitis without necrosis or infection   . Prediabetes     Past Surgical History:  Procedure Laterality Date  . HERNIA REPAIR      Family History  Problem Relation Age of Onset  . Cancer Mother   . Heart failure Father   . Kidney Stones Father   . Hypertension Other   . Cancer Other     Social History   Socioeconomic History  . Marital status: Married    Spouse name: Not on file  . Number of children: 1  . Years of  education: Not on file  . Highest education level: Not on file  Occupational History  . Occupation: Magazine features editor: [employer]  Tobacco Use  . Smoking status: Former Smoker    Types: Cigarettes    Quit date: 1980    Years since quitting: 42.2  . Smokeless tobacco: Never Used  Substance and Sexual Activity  . Alcohol use: Not Currently    Alcohol/week: 2.0 standard drinks    Types: 1 Glasses of wine, 1 Cans of beer per week  . Drug use: Never  . Sexual activity: Not Currently  Other Topics Concern  . Not on file  Social History Narrative  . Not on file   Social Determinants of Health   Financial Resource Strain: Not on file  Food Insecurity: No Food Insecurity  . Worried About Programme researcher, broadcasting/film/video in the Last Year: Never true  . Ran Out of Food in the Last Year: Never true  Transportation Needs: No Transportation Needs  . Lack of Transportation (Medical): No  . Lack of Transportation (Non-Medical): No  Physical Activity: Not on file  Stress: Not on file  Social Connections: Not on file  Intimate Partner Violence: Not on file    Outpatient Medications Prior to Visit  Medication Sig Dispense Refill  . Ascorbic Acid (VITAMIN C) 1000 MG tablet Take 1,000 mg by mouth daily.    Marland Kitchen aspirin EC 81 MG tablet  Take 81 mg by mouth daily.    . meloxicam (MOBIC) 7.5 MG tablet TAKE 1 TABLET IN THE MORNING AND AT BEDTIME. 180 tablet 1  . Multiple Vitamin (MULTIVITAMIN) capsule Take 1 capsule by mouth daily.    . Omega-3 Fatty Acids (FISH OIL) 1000 MG CAPS Take by mouth daily.     . simvastatin (ZOCOR) 10 MG tablet TAKE 1 TABLET (10 MG TOTAL) BY MOUTH AT BEDTIME. 90 tablet 1  . valsartan-hydrochlorothiazide (DIOVAN-HCT) 160-25 MG tablet TAKE 1 TABLET EVERY DAY 90 tablet 1   No facility-administered medications prior to visit.    Allergies:   Patient has no known allergies.   Social History   Tobacco Use  . Smoking status: Former Smoker    Types: Cigarettes     Quit date: 1980    Years since quitting: 42.2  . Smokeless tobacco: Never Used  Substance Use Topics  . Alcohol use: Not Currently    Alcohol/week: 2.0 standard drinks    Types: 1 Glasses of wine, 1 Cans of beer per week  . Drug use: Never     Review of Systems  Constitutional: Negative for chills and fever.  HENT: Positive for congestion, sinus pain and sore throat. Negative for ear pain.   Eyes: Negative.   Respiratory: Positive for cough.   Cardiovascular: Negative for chest pain and palpitations.  Gastrointestinal: Negative.   Genitourinary: Negative for dysuria.  Musculoskeletal: Negative for myalgias.  Neurological: Negative.   Psychiatric/Behavioral: Negative.      Labs/Other Tests and Data Reviewed:    Recent Labs: 10/06/2019: ALT 32; BUN 13; Creatinine, Ser 0.82; Potassium 4.2; Sodium 143   Recent Lipid Panel Lab Results  Component Value Date/Time   CHOL 141 10/06/2019 09:36 AM   TRIG 60 10/06/2019 09:36 AM   HDL 57 10/06/2019 09:36 AM   CHOLHDL 2.8 03/10/2019 10:00 AM   LDLCALC 71 10/06/2019 09:36 AM    Wt Readings from Last 3 Encounters:  03/22/20 181 lb (82.1 kg)  11/12/19 186 lb (84.4 kg)  10/06/19 172 lb (78 kg)     Objective:    Vital Signs:  BP 132/90   Pulse 80   Temp (!) 97.3 F (36.3 C)   Ht 5' 8.5" (1.74 m)   Wt 181 lb (82.1 kg)   BMI 27.12 kg/m    Physical Exam VS reviewed  ASSESSMENT & PLAN:   Diagnoses and all orders for this visit: Acute non-recurrent sinusitis of other sinus -     amoxicillin (AMOXIL) 875 MG tablet; Take 1 tablet (875 mg total) by mouth 2 (two) times daily. -     guaiFENesin-codeine (CHERATUSSIN AC) 100-10 MG/5ML syrup; Take 5 mLs by mouth 3 (three) times daily as needed for cough. Patient fully vaccinated, treat sinusitis with amoxicillin and cough medicine, prednisone pack       COVID-19 Education: The signs and symptoms of COVID-19 were discussed with the patient and how to seek care for testing (follow  up with PCP or arrange E-visit). The importance of social distancing was discussed today.   I spent 20 minutes dedicated to the care of this patient on the date of this encounter to include face-to-face time with the patient, as well as:   Follow Up:  In Person prn  Signed,  Brent Bulla, MD  03/22/2020 10:36 AM    [facility]

## 2020-05-12 ENCOUNTER — Telehealth: Payer: Self-pay

## 2020-05-12 NOTE — Progress Notes (Addendum)
Chronic Care Management Pharmacy Assistant   Name: Austin Atkins  MRN: [id number] DOB: Sep 05, 1945  Reason for Call: Disease state call for HTN    Recent office visits:  03/22/20 Dr. Marina Goodell (PCP) [facility] for cough, sore throat, sinusitis / added  Amoxicillin 875 mg. Take 1 po bid., Guaifensin-Codeine 100-10 mg./  Take 5 ml's po tid prn cough. And Prednisone 10 mg. Taper.  Recent consult visits:  No consult visits noted since last visit with CPP.  Hospital visits:  No hospital visits noted since last visit with CPP.  Medications: Outpatient Encounter Medications as of 05/12/2020  Medication Sig   amoxicillin (AMOXIL) 875 MG tablet Take 1 tablet (875 mg total) by mouth 2 (two) times daily.   Ascorbic Acid (VITAMIN C) 1000 MG tablet Take 1,000 mg by mouth daily.   aspirin EC 81 MG tablet Take 81 mg by mouth daily.   guaiFENesin-codeine (CHERATUSSIN AC) 100-10 MG/5ML syrup Take 5 mLs by mouth 3 (three) times daily as needed for cough.   meloxicam (MOBIC) 7.5 MG tablet TAKE 1 TABLET IN THE MORNING AND AT BEDTIME.   Multiple Vitamin (MULTIVITAMIN) capsule Take 1 capsule by mouth daily.   Omega-3 Fatty Acids (FISH OIL) 1000 MG CAPS Take by mouth daily.    predniSONE (STERAPRED UNI-PAK 21 TAB) 10 MG (21) TBPK tablet Take 6ills first day , then 5 pills day 2 and then cut down one pill day until gone   simvastatin (ZOCOR) 10 MG tablet TAKE 1 TABLET (10 MG TOTAL) BY MOUTH AT BEDTIME.   valsartan-hydrochlorothiazide (DIOVAN-HCT) 160-25 MG tablet TAKE 1 TABLET EVERY DAY   No facility-administered encounter medications on file as of 05/12/2020.    Recent Office Vitals: BP Readings from Last 3 Encounters:  03/22/20 132/90  11/12/19 116/70  10/06/19 122/78   Pulse Readings from Last 3 Encounters:  03/22/20 80  11/12/19 68  10/06/19 80    Wt Readings from Last 3 Encounters:  03/22/20 181 lb (82.1 kg)  11/12/19 186 lb (84.4 kg)  10/06/19 172 lb (78 kg)     Kidney Function Lab  Results  Component Value Date/Time   CREATININE 0.82 10/06/2019 09:36 AM   CREATININE 0.84 03/10/2019 10:00 AM   GFRNONAA 89 10/06/2019 09:36 AM   GFRAA 103 10/06/2019 09:36 AM    BMP Latest Ref Rng & Units 10/06/2019 03/10/2019  Glucose 65 - 99 mg/dL 270(J) 500(X)  BUN 8 - 27 mg/dL 13 17  Creatinine 3.81 - 1.27 mg/dL 8.29 9.37  BUN/Creat Ratio 10 - 24 16 20   Sodium 134 - 144 mmol/L 143 141  Potassium 3.5 - 5.2 mmol/L 4.2 4.3  Chloride 96 - 106 mmol/L 104 101  CO2 20 - 29 mmol/L 25 -  Calcium 8.6 - 10.2 mg/dL 9.8 9.7   Recent Office Vitals: BP Readings from Last 3 Encounters:  03/22/20 132/90  11/12/19 116/70  10/06/19 122/78   Pulse Readings from Last 3 Encounters:  03/22/20 80  11/12/19 68  10/06/19 80    Wt Readings from Last 3 Encounters:  03/22/20 181 lb (82.1 kg)  11/12/19 186 lb (84.4 kg)  10/06/19 172 lb (78 kg)     Kidney Function Lab Results  Component Value Date/Time   CREATININE 0.82 10/06/2019 09:36 AM   CREATININE 0.84 03/10/2019 10:00 AM   GFRNONAA 89 10/06/2019 09:36 AM   GFRAA 103 10/06/2019 09:36 AM    BMP Latest Ref Rng & Units 10/06/2019 03/10/2019  Glucose 65 - 99 mg/dL  112(H) 107(H)  BUN 8 - 27 mg/dL 13 17  Creatinine 3.30 - 1.27 mg/dL 0.76 2.26  BUN/Creat Ratio 10 - 24 16 20   Sodium 134 - 144 mmol/L 143 141  Potassium 3.5 - 5.2 mmol/L 4.2 4.3  Chloride 96 - 106 mmol/L 104 101  CO2 20 - 29 mmol/L 25 -  Calcium 8.6 - 10.2 mg/dL 9.8 9.7    How often are you checking your Blood Pressure? Daily  Current home BP readings:  129/83 120/88 133/77 137/90           OTC medications including pseudoephedrine or NSAIDs? No. Only supplements Vitamin C and multivitamins  Any readings above 180/120? No  If yes any symptoms of hypertensive emergency? patient denies any symptoms of high blood pressure   What recent interventions/DTPs have been made by any provider to improve Blood Pressure control since last CPP Visit: No  Any recent  hospitalizations or ED visits since last visit with CPP? No  What diet changes have been made to improve Blood Pressure Control? Still trying to stay on low-sodium foods and not add salt to food.    Adherence Review: Is the patient currently on ACE/ARB medication? Yes  Does the patient have >5 day gap between last estimated fill dates? No, CPP to review   Current antihypertensive regimen:   Valsartan-HCTZ 160-25mg .  Take 1 tablet po qd   Star Rating Drugs:  Medication:  Last Fill: Day Supply Simavastatin  02/18/2020 90DS Valsartan-HCTZ 02/18/2020 90DS  Patient asked if Dr. 02/20/2020 office had 4th COVID booster available. I asked Lamar Sprinkles, CPP and she stated that booster is available and that he will need to call the office to schedule a nurse visit to have this administered. Patient is aware.     Juliane Lack, CMA  [phone] Clinical Pharmacist Assistant

## 2020-05-12 NOTE — Progress Notes (Incomplete Revision)
    Chronic Care Management Pharmacy Assistant   Name: Austin Atkins  MRN: [id number] DOB: 1945/08/08  Attempted to reach patient x3. LVM    Recent office visits:  03/22/20 Dr. Marina Goodell (PCP)te[facility] for cough, sore throat, sinusitis / added  Amoxicillin 875 mg. Take 1 po bid., Guaifensin-Codeine 100-10 mg./  Take 5 ml's po tid prn cough. And Prednisone 10 mg. Taper.  Recent consult visits:  No consult visits noted since last visit with CPP.  Hospital visits:  No hospital visits noted since last visit with CPP.  Medications: Outpatient Encounter Medications as of 05/12/2020  Medication Sig  . amoxicillin (AMOXIL) 875 MG tablet Take 1 tablet (875 mg total) by mouth 2 (two) times daily.  . Ascorbic Acid (VITAMIN C) 1000 MG tablet Take 1,000 mg by mouth daily.  Marland Kitchen aspirin EC 81 MG tablet Take 81 mg by mouth daily.  Marland Kitchen guaiFENesin-codeine (CHERATUSSIN AC) 100-10 MG/5ML syrup Take 5 mLs by mouth 3 (three) times daily as needed for cough.  . meloxicam (MOBIC) 7.5 MG tablet TAKE 1 TABLET IN THE MORNING AND AT BEDTIME.  . Multiple Vitamin (MULTIVITAMIN) capsule Take 1 capsule by mouth daily.  . Omega-3 Fatty Acids (FISH OIL) 1000 MG CAPS Take by mouth daily.   . predniSONE (STERAPRED UNI-PAK 21 TAB) 10 MG (21) TBPK tablet Take 6ills first day , then 5 pills day 2 and then cut down one pill day until gone  . simvastatin (ZOCOR) 10 MG tablet TAKE 1 TABLET (10 MG TOTAL) BY MOUTH AT BEDTIME.  . valsartan-hydrochlorothiazide (DIOVAN-HCT) 160-25 MG tablet TAKE 1 TABLET EVERY DAY   No facility-administered encounter medications on file as of 05/12/2020.    Recent Office Vitals: BP Readings from Last 3 Encounters:  03/22/20 132/90  11/12/19 116/70  10/06/19 122/78   Pulse Readings from Last 3 Encounters:  03/22/20 80  11/12/19 68  10/06/19 80    Wt Readings from Last 3 Encounters:  03/22/20 181 lb (82.1 kg)  11/12/19 186 lb (84.4 kg)  10/06/19 172 lb (78 kg)     Kidney Function Lab  Results  Component Value Date/Time   CREATININE 0.82 10/06/2019 09:36 AM   CREATININE 0.84 03/10/2019 10:00 AM   GFRNONAA 89 10/06/2019 09:36 AM   GFRAA 103 10/06/2019 09:36 AM    BMP Latest Ref Rng & Units 10/06/2019 03/10/2019  Glucose 65 - 99 mg/dL 710(G) 269(S)  BUN 8 - 27 mg/dL 13 17  Creatinine 8.54 - 1.27 mg/dL 6.27 0.35  BUN/Creat Ratio 10 - 24 16 20   Sodium 134 - 144 mmol/L 143 141  Potassium 3.5 - 5.2 mmol/L 4.2 4.3  Chloride 96 - 106 mmol/L 104 101  CO2 20 - 29 mmol/L 25 -  Calcium 8.6 - 10.2 mg/dL 9.8 9.7     Current antihypertensive regimen:   Valsartan-HCTZ 160-25mg .  Take 1 tablet po qd   Star Rating Drugs:  Medication:  Last Fill: Day Supply Simavastatin  02/18/2020 90DS Valsartan-HCTZ 02/18/2020 90DS  ***Patient called me back on Tuesday, 05/18/2020 and LVM for me to call him back.  05/20/2020, CMA  [phone] Clinical Pharmacist Assistant *****

## 2020-05-18 DIAGNOSIS — I10 Essential (primary) hypertension: Secondary | ICD-10-CM | POA: Diagnosis not present

## 2020-05-18 DIAGNOSIS — Z791 Long term (current) use of non-steroidal anti-inflammatories (NSAID): Secondary | ICD-10-CM | POA: Diagnosis not present

## 2020-05-18 DIAGNOSIS — Z7982 Long term (current) use of aspirin: Secondary | ICD-10-CM | POA: Diagnosis not present

## 2020-05-18 DIAGNOSIS — Z87891 Personal history of nicotine dependence: Secondary | ICD-10-CM | POA: Diagnosis not present

## 2020-05-18 DIAGNOSIS — E785 Hyperlipidemia, unspecified: Secondary | ICD-10-CM | POA: Diagnosis not present

## 2020-05-18 DIAGNOSIS — Z8249 Family history of ischemic heart disease and other diseases of the circulatory system: Secondary | ICD-10-CM | POA: Diagnosis not present

## 2020-05-18 DIAGNOSIS — M1990 Unspecified osteoarthritis, unspecified site: Secondary | ICD-10-CM | POA: Diagnosis not present

## 2020-05-18 DIAGNOSIS — Z809 Family history of malignant neoplasm, unspecified: Secondary | ICD-10-CM | POA: Diagnosis not present

## 2020-05-18 DIAGNOSIS — N529 Male erectile dysfunction, unspecified: Secondary | ICD-10-CM | POA: Diagnosis not present

## 2020-07-13 ENCOUNTER — Ambulatory Visit (INDEPENDENT_AMBULATORY_CARE_PROVIDER_SITE_OTHER): Payer: Medicare PPO

## 2020-07-13 DIAGNOSIS — Z23 Encounter for immunization: Secondary | ICD-10-CM

## 2020-07-13 NOTE — Progress Notes (Signed)
   Covid-19 Vaccination Clinic  Name:  PENIEL HASS    MRN: [id number] DOB: 08-Jan-1945  07/13/2020  Mr. Granquist was observed post Covid-19 immunization for 15 minutes without incident. He was provided with Vaccine Information Sheet and instruction to access the V-Safe system.   Mr. Alperin was instructed to call 911 with any severe reactions post vaccine: Difficulty breathing  Swelling of face and throat  A fast heartbeat  A bad rash all over body  Dizziness and weakness   Immunizations Administered     Name Date Dose VIS Date Route   Moderna Covid-19 Booster Vaccine 07/13/2020 10:15 AM 0.25 mL 10/22/2019 Intramuscular   Manufacturer: Moderna   Lot: [id number]   NDC: 84132-440-10

## 2020-08-09 ENCOUNTER — Telehealth (INDEPENDENT_AMBULATORY_CARE_PROVIDER_SITE_OTHER): Payer: Medicare PPO | Admitting: Nurse Practitioner

## 2020-08-09 ENCOUNTER — Encounter: Payer: Self-pay | Admitting: Nurse Practitioner

## 2020-08-09 DIAGNOSIS — J018 Other acute sinusitis: Secondary | ICD-10-CM

## 2020-08-09 MED ORDER — AZITHROMYCIN 250 MG PO TABS: ORAL_TABLET | ORAL | 0 refills | Status: AC

## 2020-08-09 NOTE — Progress Notes (Signed)
Virtual Visit via Video Note   This visit type was conducted due to national recommendations for restrictions regarding the COVID-19 Pandemic (e.g. social distancing) in an effort to limit this patient's exposure and mitigate transmission in our community.  Due to his co-morbid illnesses, this patient is at least at moderate risk for complications without adequate follow up.  This format is felt to be most appropriate for this patient at this time.  All issues noted in this document were discussed and addressed.  A limited physical exam was performed with this format.  A verbal consent was obtained for the virtual visit.   Date:  08/09/2020   ID:  Austin Atkins, DOB 1945-01-14, MRN [id number]  Patient Location: Home Provider Location: Office/Clinic  PCP:  Blane Ohara, MD   Evaluation Performed:  Established   Chief Complaint:  Headache  History of Present Illness:    Austin Atkins is a 75 y.o. male with low-grade fever, headache, sinus congestion/pressure, cough, and sore throat. Onset of symptoms was one day ago. Treatment has included Ibuprofen. He denies known exposure to ill-contacts. He has not taken a home COVID-19 test. He has received COVID-19 vaccinations x 4.   The patient does have symptoms concerning for COVID-19 infection (fever, chills, cough, or new shortness of breath).    Past Medical History:  Diagnosis Date   Atherosclerosis of aorta (HCC)    CAD (coronary artery disease)    Erectile dysfunction    High blood pressure    High cholesterol    Other acute pancreatitis without necrosis or infection    Prediabetes     Past Surgical History:  Procedure Laterality Date   HERNIA REPAIR      Family History  Problem Relation Age of Onset   Cancer Mother    Heart failure Father    Kidney Stones Father    Hypertension Other    Cancer Other     Social History   Socioeconomic History   Marital status: Married    Spouse name: Not on file   Number of  children: 1   Years of education: Not on file   Highest education level: Not on file  Occupational History   Occupation: Magazine features editor: [employer]  Tobacco Use   Smoking status: Former    Types: Cigarettes    Quit date: 1980    Years since quitting: 42.6   Smokeless tobacco: Never  Substance and Sexual Activity   Alcohol use: Not Currently    Alcohol/week: 2.0 standard drinks    Types: 1 Glasses of wine, 1 Cans of beer per week   Drug use: Never   Sexual activity: Not Currently  Other Topics Concern   Not on file  Social History Narrative   Not on file   Social Determinants of Health   Financial Resource Strain: Not on file  Food Insecurity: No Food Insecurity   Worried About Running Out of Food in the Last Year: Never true   Ran Out of Food in the Last Year: Never true  Transportation Needs: No Transportation Needs   Lack of Transportation (Medical): No   Lack of Transportation (Non-Medical): No  Physical Activity: Not on file  Stress: Not on file  Social Connections: Not on file  Intimate Partner Violence: Not on file    Outpatient Medications Prior to Visit  Medication Sig Dispense Refill   amoxicillin (AMOXIL) 875 MG tablet Take 1 tablet (875 mg total) by mouth 2 (  two) times daily. 20 tablet 0   Ascorbic Acid (VITAMIN C) 1000 MG tablet Take 1,000 mg by mouth daily.     aspirin EC 81 MG tablet Take 81 mg by mouth daily.     guaiFENesin-codeine (CHERATUSSIN AC) 100-10 MG/5ML syrup Take 5 mLs by mouth 3 (three) times daily as needed for cough. 120 mL 0   meloxicam (MOBIC) 7.5 MG tablet TAKE 1 TABLET IN THE MORNING AND AT BEDTIME. 180 tablet 1   Multiple Vitamin (MULTIVITAMIN) capsule Take 1 capsule by mouth daily.     Omega-3 Fatty Acids (FISH OIL) 1000 MG CAPS Take by mouth daily.      predniSONE (STERAPRED UNI-PAK 21 TAB) 10 MG (21) TBPK tablet Take 6ills first day , then 5 pills day 2 and then cut down one pill day until gone 21 tablet 0    simvastatin (ZOCOR) 10 MG tablet TAKE 1 TABLET (10 MG TOTAL) BY MOUTH AT BEDTIME. 90 tablet 1   valsartan-hydrochlorothiazide (DIOVAN-HCT) 160-25 MG tablet TAKE 1 TABLET EVERY DAY 90 tablet 1   No facility-administered medications prior to visit.    Allergies:   Patient has no known allergies.   Social History   Tobacco Use   Smoking status: Former    Types: Cigarettes    Quit date: 1980    Years since quitting: 42.6   Smokeless tobacco: Never  Substance Use Topics   Alcohol use: Not Currently    Alcohol/week: 2.0 standard drinks    Types: 1 Glasses of wine, 1 Cans of beer per week   Drug use: Never     Review of Systems  Constitutional:  Positive for fever. Negative for chills, diaphoresis and malaise/fatigue.  HENT:  Positive for congestion, sinus pain and sore throat. Negative for ear pain.   Eyes:  Negative for double vision and pain.  Respiratory:  Positive for cough. Negative for sputum production, shortness of breath and wheezing.   Cardiovascular:  Negative for chest pain and leg swelling.  Gastrointestinal:  Negative for constipation, diarrhea, heartburn, nausea and vomiting.  Genitourinary:  Negative for dysuria, frequency and urgency.  Musculoskeletal:  Negative for joint pain and myalgias.  Skin: Negative.   Neurological:  Positive for headaches. Negative for dizziness and weakness.  Endo/Heme/Allergies: Negative.   Psychiatric/Behavioral:  Negative for depression. The patient is not nervous/anxious.     Labs/Other Tests and Data Reviewed:    Recent Labs: 10/06/2019: ALT 32; BUN 13; Creatinine, Ser 0.82; Potassium 4.2; Sodium 143   Recent Lipid Panel Lab Results  Component Value Date/Time   CHOL 141 10/06/2019 09:36 AM   TRIG 60 10/06/2019 09:36 AM   HDL 57 10/06/2019 09:36 AM   CHOLHDL 2.8 03/10/2019 10:00 AM   LDLCALC 71 10/06/2019 09:36 AM    Wt Readings from Last 3 Encounters:  03/22/20 181 lb (82.1 kg)  11/12/19 186 lb (84.4 kg)  10/06/19 172 lb  (78 kg)     Objective:    Vital Signs:  There were no vitals taken for this visit.    Physical Exam No physical exam due to telemedicine visit  ASSESSMENT & PLAN:     1. Acute non-recurrent sinusitis of other sinus - azithromycin (ZITHROMAX) 250 MG tablet; Take 2 tablets on day 1, then 1 tablet daily on days 2 through 5  Dispense: 6 tablet; Refill: 0    Rest and push fluids Continue Ibuprofen and symptom management Take home COVID-19 test    COVID-19 Education: The signs and symptoms  of COVID-19 were discussed with the patient and how to seek care for testing (follow up with PCP or arrange E-visit). The importance of social distancing was discussed today.   I spent 10 minutes dedicated to the care of this patient on the date of this encounter to include face-to-face time with the patient, as well as: EMR and prescription medication management.  Follow Up:  In Person prn  I, Janie Morning, NP, have reviewed all documentation for this visit. The documentation on 08/09/20 for the exam, diagnosis, procedures, and orders are all accurate and complete.   I,Lauren M Auman,acting as a Neurosurgeon for [employer], NP.,have documented all relevant documentation on the behalf of Janie Morning, NP,as directed by  Janie Morning, NP while in the presence of Janie Morning, NP.    Lydia Guiles, DNP 08/09/2020 9:56AM    [facility]

## 2020-08-14 DIAGNOSIS — Z20828 Contact with and (suspected) exposure to other viral communicable diseases: Secondary | ICD-10-CM | POA: Diagnosis not present

## 2020-08-14 DIAGNOSIS — R051 Acute cough: Secondary | ICD-10-CM | POA: Diagnosis not present

## 2020-08-14 DIAGNOSIS — Z20822 Contact with and (suspected) exposure to covid-19: Secondary | ICD-10-CM | POA: Diagnosis not present

## 2020-08-18 ENCOUNTER — Other Ambulatory Visit: Payer: Self-pay | Admitting: Family Medicine

## 2020-08-20 NOTE — Telephone Encounter (Signed)
Refill sent to pharmacy.   

## 2020-08-25 ENCOUNTER — Other Ambulatory Visit: Payer: Self-pay | Admitting: Family Medicine

## 2020-09-14 ENCOUNTER — Ambulatory Visit (INDEPENDENT_AMBULATORY_CARE_PROVIDER_SITE_OTHER): Payer: Medicare PPO

## 2020-09-14 ENCOUNTER — Other Ambulatory Visit: Payer: Self-pay

## 2020-09-14 ENCOUNTER — Telehealth: Payer: Medicare PPO

## 2020-09-14 DIAGNOSIS — R7301 Impaired fasting glucose: Secondary | ICD-10-CM

## 2020-09-14 DIAGNOSIS — E782 Mixed hyperlipidemia: Secondary | ICD-10-CM

## 2020-09-14 DIAGNOSIS — I10 Essential (primary) hypertension: Secondary | ICD-10-CM

## 2020-09-14 NOTE — Progress Notes (Signed)
Chronic Care Management Pharmacy Note  09/14/2020 Name:  Austin Atkins MRN:  [id number] DOB:  10/28/1945  Summary: Pleasant 75 year old male presents for f/u CCM visit. 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 He has 1 daughter who is about to get marries in 2022. He and his wife have a condo in [place] that she lives in and he commutes Thurs-Sun to be there and Mon-Wed lives in [place]. He enjoys watching NASCAR in his free time  Recommendations/Changes made from today's visit: -Asked him to schedule appt with you before end of year (Last seen Nov 2021)  Plan: Plan: At goal,  patient stable/ symptoms controlled    Subjective: Austin Atkins is an 75 y.o. year old male who is a primary patient of Cox, Kirsten, MD.  The CCM team was consulted for assistance with disease management and care coordination needs.    Engaged with patient by telephone for follow up visit in response to provider referral for pharmacy case management and/or care coordination services.   Consent to Services:  The patient was given the following information about Chronic Care Management services today, agreed to services, and gave verbal consent: 1. CCM service includes personalized support from designated clinical staff supervised by the primary care provider, including individualized plan of care and coordination with other care providers 2. 24/7 contact phone numbers for assistance for urgent and routine care needs. 3. Service will only be billed when office clinical staff spend 20 minutes or more in a month to coordinate care. 4. Only one practitioner may furnish and bill the service in a calendar month. 5.The patient may stop CCM services at any time (effective at the end of the month) by phone call to the office staff. 6. The patient will be  responsible for cost sharing (co-pay) of up to 20% of the service fee (after annual deductible is met). Patient agreed to services and consent obtained.  Patient Care Team: Blane Ohara, MD as PCP - General (Family Medicine) Earvin Hansen, [facility] as Pharmacist (Pharmacist)  Recent office visits: None  Recent consult visits: None  Hospital visits: None   Objective:  Lab Results  Component Value Date   CREATININE 0.82 10/06/2019   BUN 13 10/06/2019   GFRNONAA 89 10/06/2019   GFRAA 103 10/06/2019   NA 143 10/06/2019   K 4.2 10/06/2019   CALCIUM 9.8 10/06/2019   CO2 25 10/06/2019   GLUCOSE 112 (H) 10/06/2019    Lab Results  Component Value Date/Time   HGBA1C 6.1 (H) 10/06/2019 09:36 AM   HGBA1C 6.0 (H) 03/10/2019 10:00 AM    Last diabetic Eye exam: No results found for: HMDIABEYEEXA  Last diabetic Foot exam: No results found for: HMDIABFOOTEX   Lab Results  Component Value Date   CHOL 141 10/06/2019   HDL 57 10/06/2019   LDLCALC 71 10/06/2019   TRIG 60 10/06/2019   CHOLHDL 2.8 03/10/2019    Hepatic Function Latest Ref Rng & Units 10/06/2019 03/10/2019  Total Protein 6.0 - 8.5 g/dL 6.3 6.3  Albumin 3.7 - 4.7 g/dL 4.6 4.2  AST 0 - 40 IU/L 33 20  ALT 0 - 44 IU/L 32 -  Alk Phosphatase 44 - 121 IU/L 107 102  Total Bilirubin 0.0 - 1.2 mg/dL 6.1(T) 0.5    Lab Results  Component Value Date/Time   TSH 2.680 03/10/2019 10:00 AM    CBC Latest Ref Rng & Units 03/10/2019  WBC 3.4 - 10.8 x10E3/uL 7.2  Hemoglobin 13.0 - 17.7 g/dL 71.9  Hematocrit 22.2 - 51.0 % 46.5  Platelets 150 - 450 x10E3/uL 263    No results found for: VD25OH  Clinical ASCVD: Yes  The 10-year ASCVD risk score (Arnett DK, et al., 2019) is: 20.6%   Values used to calculate the score:     Age: 78 years     Sex: Male     Is Non-Hispanic African American: No     Diabetic: No     Tobacco smoker: No     Systolic Blood Pressure: 132 mmHg     Is BP treated: Yes     HDL Cholesterol: 57 mg/dL      Total Cholesterol: 141 mg/dL    Depression screen PHQ 2/9 03/10/2019  Decreased Interest 0  Down, Depressed, Hopeless 0  PHQ - 2 Score 0     Other: (CHADS2VASc if Afib, MMRC or CAT for COPD, ACT, DEXA)  Social History   Tobacco Use  Smoking Status Former   Types: Cigarettes   Quit date: 1980   Years since quitting: 42.7  Smokeless Tobacco Never   BP Readings from Last 3 Encounters:  03/22/20 132/90  11/12/19 116/70  10/06/19 122/78   Pulse Readings from Last 3 Encounters:  03/22/20 80  11/12/19 68  10/06/19 80   Wt Readings from Last 3 Encounters:  03/22/20 181 lb (82.1 kg)  11/12/19 186 lb (84.4 kg)  10/06/19 172 lb (78 kg)   BMI Readings from Last 3 Encounters:  03/22/20 27.12 kg/m  11/12/19 27.47 kg/m  10/06/19 25.40 kg/m    Assessment/Interventions: Review of patient past medical history, allergies, medications, health status, including review of consultants reports, laboratory and other test data, was performed as part of comprehensive evaluation and provision of chronic care management services.   SDOH:  (Social Determinants of Health) assessments and interventions performed: Yes SDOH Interventions    Flowsheet Row Most Recent Value  SDOH Interventions   Financial Strain Interventions Intervention Not Indicated  Transportation Interventions Intervention Not Indicated      SDOH Screenings   Alcohol Screen: Not on file  Depression (PHQ2-9): Not on file  Financial Resource Strain: Low Risk    Difficulty of Paying Living Expenses: Not hard at all  Food Insecurity: No Food Insecurity   Worried About Programme researcher, broadcasting/film/video in the Last Year: Never true   Ran Out of Food in the Last Year: Never true  Housing: Low Risk    Last Housing Risk Score: 0  Physical Activity: Not on file  Social Connections: Not on file  Stress: Not on file  Tobacco Use: Medium Risk   Smoking Tobacco Use: Former   Smokeless Tobacco Use: Never  Transportation Needs: No  Transportation Needs   Lack of Transportation (Medical): No   Lack of Transportation (Non-Medical): No    CCM Care Plan  No Known Allergies  Medications Reviewed Today     Reviewed by Zettie Pho, [facility] (Pharmacist) on 09/14/20 at 0848  Med List Status: <None>   Medication Order Taking? Sig Documenting Provider Last Dose Status Informant  amoxicillin (AMOXIL) 875 MG tablet [id number] No Take 1 tablet (875 mg total) by mouth 2 (two) times daily.  Patient not taking: Reported on 09/14/2020   Abigail Miyamoto, MD Not Taking Consider Medication Status and Discontinue  Ascorbic Acid (VITAMIN C) 1000 MG tablet [id number] Yes Take 1,000 mg by mouth daily. [provider] Taking Active   aspirin EC 81 MG tablet [id number] Yes Take 81 mg by mouth daily. [provider] Taking Active   guaiFENesin-codeine (CHERATUSSIN AC) 100-10 MG/5ML syrup [id number] No Take 5 mLs by mouth 3 (three) times daily as needed for cough.  Patient not taking: Reported on 09/14/2020   Lillard Anes, MD Not Taking Consider Medication Status and Discontinue   meloxicam (MOBIC) 7.5 MG tablet [id number] Yes TAKE 1 TABLET IN THE MORNING AND AT BEDTIME Cox, Kirsten, MD Taking Active   Multiple Vitamin (MULTIVITAMIN) capsule [id number] Yes Take 1 capsule by mouth daily. [provider] Taking Active   Omega-3 Fatty Acids (FISH OIL) 1000 MG CAPS [id number] Yes Take by mouth daily.  [provider] Taking Active   predniSONE (STERAPRED UNI-PAK 21 TAB) 10 MG (21) TBPK tablet [id number] No Take 6ills first day , then 5 pills day 2 and then cut down one pill day until gone  Patient not taking: Reported on 09/14/2020   Lillard Anes, MD Not Taking Consider Medication Status and Discontinue   simvastatin (ZOCOR) 10 MG tablet [id number] Yes TAKE 1 TABLET AT BEDTIME Rochel Brome, MD Taking Active   valsartan-hydrochlorothiazide (DIOVAN-HCT) 160-25 MG tablet [id number] Yes TAKE 1  TABLET EVERY Luster Landsberg, MD Taking Active             Patient Active Problem List   Diagnosis Date Noted   Sinusitis 03/22/2020   Left lateral epicondylitis 05/04/2019   Other chronic pancreatitis ([place]) 03/10/2019   Mixed hyperlipidemia 03/10/2019   Essential hypertension, benign 03/10/2019   Impaired fasting glucose 03/10/2019   Atherosclerosis of aorta ([place]) 03/10/2019   BPH (benign prostatic hyperplasia) 03/10/2019   Avascular necrosis of bones of both hips ([place]) 12/11/2018    Immunization History  Administered Date(s) Administered   Fluad Quad(high Dose 65+) 09/11/2018, 10/06/2019   Influenza-Unspecified 10/02/2017   Moderna SARS-COV2 Booster Vaccination 11/04/2019, 07/13/2020   Moderna Sars-Covid-2 Vaccination 01/23/2019, 02/26/2019   Pneumococcal Conjugate-13 08/15/2016   Pneumococcal Polysaccharide-23 12/07/2010   Td 12/07/2010   Zoster Recombinat (Shingrix) 09/11/2018, 11/20/2018    Conditions to be addressed/monitored:  Hypertension, Hyperlipidemia, and Diabetes  Care Plan : [place]  Updates made by Lane Hacker, RPH since 09/14/2020 12:00 AM     Problem: Pre-DM, Lipids, HTN   Priority: High  Onset Date: 09/14/2020     Goal: Disease State Management   Start Date: 09/14/2020  Expected End Date: 09/14/2021  This Visit's Progress: On track  Priority: High  Note:   Current Barriers:  Does not maintain contact with provider office Does not contact provider office for questions/concerns  Pharmacist Clinical Goal(s):  Patient will contact provider office for questions/concerns as evidenced notation of same in electronic health record through collaboration with PharmD and provider.   Interventions: 1:1 collaboration with Rochel Brome, MD regarding development and update of comprehensive plan of care as evidenced by provider attestation and co-signature Inter-disciplinary care team collaboration (see longitudinal plan of  care) Comprehensive medication review performed; medication list updated in electronic medical record  Hypertension (BP goal <140/90) BP Readings from Last 3 Encounters:  03/22/20 132/90  11/12/19 116/70  10/06/19 122/78  -Controlled -Current treatment: Valsarrant/HCTZ 160/25 -Medications previously tried: N/A  -Current home readings: N/a -Current dietary habits: Tries to eat healthy   -Current exercise habits: Chores -Denies hypotensive/hypertensive symptoms -Educated on BP goals  and benefits of medications for prevention of heart attack, stroke and kidney damage; -Counseled to monitor BP at home if he feels elevated, document, and provide log at future appointments -Recommended to continue current medication  Hyperlipidemia: (LDL goal < 100) Lab Results  Component Value Date   CHOL 141 10/06/2019   CHOL 148 03/10/2019   Lab Results  Component Value Date   HDL 57 10/06/2019   HDL 52 03/10/2019   Lab Results  Component Value Date   LDLCALC 71 10/06/2019   LDLCALC 83 03/10/2019   Lab Results  Component Value Date   TRIG 60 10/06/2019   TRIG 63 03/10/2019   Lab Results  Component Value Date   CHOLHDL 2.8 03/10/2019  No results found for: LDLDIRECT -Controlled -Current treatment: Simvastatin 10mg  -Medications previously tried: N/A  -Current dietary habits: Tries to eat healthy   -Current exercise habits: Chores -Educated on Cholesterol goals;  -Recommended to continue current medication  Pre-Diabetes (A1c goal <6.5%) Lab Results  Component Value Date   HGBA1C 6.1 (H) 10/06/2019   HGBA1C 6.0 (H) 03/10/2019   Lab Results  Component Value Date   LDLCALC 71 10/06/2019   CREATININE 0.82 10/06/2019   Lab Results  Component Value Date   NA 143 10/06/2019   K 4.2 10/06/2019   CREATININE 0.82 10/06/2019   GFRNONAA 89 10/06/2019   GFRAA 103 10/06/2019   GLUCOSE 112 (H) 10/06/2019   Lab Results  Component Value Date   WBC 7.2 03/10/2019   HGB 15.7  03/10/2019   HCT 46.5 03/10/2019   MCV 87 03/10/2019   PLT 263 03/10/2019   -Controlled -Current medications: None -Medications previously tried: N/A  -Current home glucose readings fasting glucose: Doesn't test post prandial glucose: Doesn't test -Denies hypoglycemic/hyperglycemic symptoms -Current exercise: Chores -Educated on A1c and blood sugar goals; -Counseled to check feet daily and get yearly eye exams -Recommended to continue current medication  Patient Goals/Self-Care Activities Patient needs f/u appt and AWV Patient will call office after talking with me to schedule Patient will:  - target a minimum of 150 minutes of moderate intensity exercise weekly   Follow Up Plan: The patient has been provided with contact information for the care management team and has been advised to call with any health related questions or concerns.        Medication Assistance: None required.  Patient affirms current coverage meets needs.  Compliance/Adherence/Medication fill history: Care Gaps: Needs AWV  Star-Rating Drugs: Simvastatin  Valsartan/HCTZ  Patient's preferred pharmacy is:  [facility] [id number] - [place], [address] AT [place] [address] [place] [address] Phone: [phone] Fax: [phone]  [facility] ([facility] - [place], [place] [place] [address] [place] [address] Phone: [phone] Fax: [phone]  Uses pill box? No - Not enough meds Pt endorses 100% compliance  We discussed: Benefits of medication synchronization, packaging and delivery as well as enhanced pharmacist oversight with [facility]. Patient decided to: Continue current medication management strategy  Care Plan and Follow Up Patient Decision:  Patient agrees to Care Plan and Follow-up.  Plan: The patient has been provided with contact information for the care  management team and has been advised to call with any health related questions or concerns.

## 2020-09-14 NOTE — Patient Instructions (Signed)
Visit Information   Goals Addressed             This Visit's Progress    Set My Target A1C-Diabetes Type 2       Timeframe:  Long-Range Goal Priority:  High Start Date:                             Expected End Date:                       Follow Up Date 09/14/21   - set target A1C    Why is this important?   Your target A1C is decided together by you and your doctor.  It is based on several things like your age and other health issues.    Notes:      Track and Manage My Blood Pressure-Hypertension       Timeframe:  Long-Range Goal Priority:  High Start Date:                             Expected End Date:                       Follow Up Date 09/14/21   - choose a place to take my blood pressure (home, clinic or office, retail store)    Why is this important?   You won't feel high blood pressure, but it can still hurt your blood vessels.  High blood pressure can cause heart or kidney problems. It can also cause a stroke.  Making lifestyle changes like losing a little weight or eating less salt will help.  Checking your blood pressure at home and at different times of the day can help to control blood pressure.  If the doctor prescribes medicine remember to take it the way the doctor ordered.  Call the office if you cannot afford the medicine or if there are questions about it.     Notes:        Patient Care Plan: CCM Pharmacy Care Plan     Problem Identified: Pre-DM, Lipids, HTN   Priority: High  Onset Date: 09/14/2020     Goal: Disease State Management   Start Date: 09/14/2020  Expected End Date: 09/14/2021  This Visit's Progress: On track  Priority: High  Note:   Current Barriers:  Does not maintain contact with provider office Does not contact provider office for questions/concerns  Pharmacist Clinical Goal(s):  Patient will contact provider office for questions/concerns as evidenced notation of same in electronic health record through collaboration with  PharmD and provider.   Interventions: 1:1 collaboration with Blane Ohara, MD regarding development and update of comprehensive plan of care as evidenced by provider attestation and co-signature Inter-disciplinary care team collaboration (see longitudinal plan of care) Comprehensive medication review performed; medication list updated in electronic medical record  Hypertension (BP goal <140/90) BP Readings from Last 3 Encounters:  03/22/20 132/90  11/12/19 116/70  10/06/19 122/78  -Controlled -Current treatment: Valsarrant/HCTZ 160/25 -Medications previously tried: N/A  -Current home readings: N/a -Current dietary habits: Tries to eat healthy   -Current exercise habits: Chores -Denies hypotensive/hypertensive symptoms -Educated on BP goals and benefits of medications for prevention of heart attack, stroke and kidney damage; -Counseled to monitor BP at home if he feels elevated, document, and provide log at future appointments -Recommended to continue current medication  Hyperlipidemia: (LDL goal < 100) Lab Results  Component Value Date   CHOL 141 10/06/2019   CHOL 148 03/10/2019   Lab Results  Component Value Date   HDL 57 10/06/2019   HDL 52 03/10/2019   Lab Results  Component Value Date   LDLCALC 71 10/06/2019   LDLCALC 83 03/10/2019   Lab Results  Component Value Date   TRIG 60 10/06/2019   TRIG 63 03/10/2019   Lab Results  Component Value Date   CHOLHDL 2.8 03/10/2019  No results found for: LDLDIRECT -Controlled -Current treatment: Simvastatin 10mg  -Medications previously tried: N/A  -Current dietary habits: Tries to eat healthy   -Current exercise habits: Chores -Educated on Cholesterol goals;  -Recommended to continue current medication  Pre-Diabetes (A1c goal <6.5%) Lab Results  Component Value Date   HGBA1C 6.1 (H) 10/06/2019   HGBA1C 6.0 (H) 03/10/2019   Lab Results  Component Value Date   LDLCALC 71 10/06/2019   CREATININE 0.82 10/06/2019    Lab Results  Component Value Date   NA 143 10/06/2019   K 4.2 10/06/2019   CREATININE 0.82 10/06/2019   GFRNONAA 89 10/06/2019   GFRAA 103 10/06/2019   GLUCOSE 112 (H) 10/06/2019   Lab Results  Component Value Date   WBC 7.2 03/10/2019   HGB 15.7 03/10/2019   HCT 46.5 03/10/2019   MCV 87 03/10/2019   PLT 263 03/10/2019   -Controlled -Current medications: None -Medications previously tried: N/A  -Current home glucose readings fasting glucose: Doesn't test post prandial glucose: Doesn't test -Denies hypoglycemic/hyperglycemic symptoms -Current exercise: Chores -Educated on A1c and blood sugar goals; -Counseled to check feet daily and get yearly eye exams -Recommended to continue current medication  Patient Goals/Self-Care Activities Patient needs f/u appt and AWV Patient will call office after talking with me to schedule Patient will:  - target a minimum of 150 minutes of moderate intensity exercise weekly   Follow Up Plan: The patient has been provided with contact information for the care management team and has been advised to call with any health related questions or concerns.        The patient verbalized understanding of instructions, educational materials, and care plan provided today and declined offer to receive copy of patient instructions, educational materials, and care plan.  The pharmacy team will reach out to the patient again over the next 180 days.   05/10/2019, [facility]

## 2020-10-01 DIAGNOSIS — I10 Essential (primary) hypertension: Secondary | ICD-10-CM

## 2020-10-01 DIAGNOSIS — E782 Mixed hyperlipidemia: Secondary | ICD-10-CM | POA: Diagnosis not present

## 2020-10-21 ENCOUNTER — Other Ambulatory Visit: Payer: Self-pay

## 2020-10-21 ENCOUNTER — Ambulatory Visit (INDEPENDENT_AMBULATORY_CARE_PROVIDER_SITE_OTHER): Payer: Medicare PPO

## 2020-10-21 VITALS — HR 72 | Resp 16 | Ht 69.0 in | Wt 197.8 lb

## 2020-10-21 DIAGNOSIS — E782 Mixed hyperlipidemia: Secondary | ICD-10-CM | POA: Diagnosis not present

## 2020-10-21 DIAGNOSIS — N401 Enlarged prostate with lower urinary tract symptoms: Secondary | ICD-10-CM

## 2020-10-21 DIAGNOSIS — Z23 Encounter for immunization: Secondary | ICD-10-CM | POA: Diagnosis not present

## 2020-10-21 DIAGNOSIS — Z0000 Encounter for general adult medical examination without abnormal findings: Secondary | ICD-10-CM | POA: Diagnosis not present

## 2020-10-21 DIAGNOSIS — I10 Essential (primary) hypertension: Secondary | ICD-10-CM | POA: Diagnosis not present

## 2020-10-21 NOTE — Progress Notes (Signed)
Subjective:   Austin Atkins is a 75 y.o. male who presents for Medicare Annual/Subsequent preventive examination.  This wellness visit is conducted by a nurse.  The patient's medications were reviewed and reconciled since the patient's last visit.  History details were provided by the patient.  The history appears to be reliable.    Patient's last AWV was one year ago.   Medical History: Patient history and Family history was reviewed  Medications, Allergies, and preventative health maintenance was reviewed and updated.  Patient thought today's visit was his yearly physical and was fasting.  Labs were done per Dr Sedalia Muta and patient instructed to follow-up for results and medication management.   Review of Systems    ROS-Negative Cardiac Risk Factors include: advanced age (>95men, >43 women);male gender;dyslipidemia     Objective:    Today's Vitals   10/21/20 1530  Pulse: 72  Resp: 16  SpO2: 99%  Weight: 197 lb 12.8 oz (89.7 kg)  Height: 5\' 9"  (1.753 m)  PainSc: 0-No pain   Body mass index is 29.21 kg/m.  Advanced Directives 10/21/2020 03/10/2019  Does Patient Have a Medical Advance Directive? Yes Yes  Type of 05/10/2019 of Austin Atkins;Living will Healthcare Power of Austin Atkins;Living will  Does patient want to make changes to medical advance directive? No - Patient declined -  Copy of Healthcare Power of Attorney in Chart? No - copy requested -    Current Medications (verified) Outpatient Encounter Medications as of 10/21/2020  Medication Sig   Ascorbic Acid (VITAMIN C) 1000 MG tablet Take 1,000 mg by mouth daily.   aspirin EC 81 MG tablet Take 81 mg by mouth daily.   meloxicam (MOBIC) 7.5 MG tablet TAKE 1 TABLET IN THE MORNING AND AT BEDTIME   Multiple Vitamin (MULTIVITAMIN) capsule Take 1 capsule by mouth daily.   Omega-3 Fatty Acids (FISH OIL) 1000 MG CAPS Take by mouth daily.    simvastatin (ZOCOR) 10 MG tablet TAKE 1 TABLET AT BEDTIME    valsartan-hydrochlorothiazide (DIOVAN-HCT) 160-25 MG tablet TAKE 1 TABLET EVERY DAY   [DISCONTINUED] amoxicillin (AMOXIL) 875 MG tablet Take 1 tablet (875 mg total) by mouth 2 (two) times daily. (Patient not taking: Reported on 09/14/2020)   [DISCONTINUED] guaiFENesin-codeine (CHERATUSSIN AC) 100-10 MG/5ML syrup Take 5 mLs by mouth 3 (three) times daily as needed for cough. (Patient not taking: Reported on 09/14/2020)   [DISCONTINUED] predniSONE (STERAPRED UNI-PAK 21 TAB) 10 MG (21) TBPK tablet Take 6ills first day , then 5 pills day 2 and then cut down one pill day until gone (Patient not taking: Reported on 09/14/2020)   No facility-administered encounter medications on file as of 10/21/2020.    Allergies (verified) Patient has no known allergies.   History: Past Medical History:  Diagnosis Date   Atherosclerosis of aorta (HCC)    CAD (coronary artery disease)    Erectile dysfunction    High blood pressure    High cholesterol    Other acute pancreatitis without necrosis or infection    Prediabetes    Past Surgical History:  Procedure Laterality Date   HERNIA REPAIR     Family History  Problem Relation Age of Onset   Cancer Mother    Heart failure Father    Kidney Stones Father    Hypertension Other    Cancer Other    Social History   Socioeconomic History   Marital status: Married    Spouse name: 10/23/2020   Number of children: 1  Years of education: Not on file   Highest education level: Not on file  Occupational History   Occupation: Magazine features editor: [employer]  Tobacco Use   Smoking status: Former    Types: Cigarettes    Quit date: 1980    Years since quitting: 42.8   Smokeless tobacco: Never  Vaping Use   Vaping Use: Never used  Substance and Sexual Activity   Alcohol use: Not Currently    Alcohol/week: 2.0 standard drinks    Types: 1 Glasses of wine, 1 Cans of beer per week   Drug use: Never   Sexual activity: Not Currently  Other Topics  Concern   Not on file  Social History Narrative   Not on file   Social Determinants of Health   Financial Resource Strain: Low Risk    Difficulty of Paying Living Expenses: Not hard at all  Food Insecurity: No Food Insecurity   Worried About Programme researcher, broadcasting/film/video in the Last Year: Never true   Ran Out of Food in the Last Year: Never true  Transportation Needs: No Transportation Needs   Lack of Transportation (Medical): No   Lack of Transportation (Non-Medical): No  Physical Activity: Sufficiently Active   Days of Exercise per Week: 4 days   Minutes of Exercise per Session: 40 min  Stress: Not on file  Social Connections: Not on file    Tobacco Counseling Counseling given: Patient does not use tobacco products   Clinical Intake:  Pre-visit preparation completed: Yes  Pain : No/denies pain Pain Score: 0-No pain     BMI - recorded: 29.21 Nutritional Status: BMI 25 -29 Overweight Nutritional Risks: None Diabetes: No How often do you need to have someone help you when you read instructions, pamphlets, or other written materials from your doctor or pharmacy?: 1 - Never Interpreter Needed?: No    Activities of Daily Living In your present state of health, do you have any difficulty performing the following activities: 10/21/2020  Hearing? N  Vision? N  Difficulty concentrating or making decisions? N  Walking or climbing stairs? N  Dressing or bathing? N  Doing errands, shopping? N  Preparing Food and eating ? N  Using the Toilet? N  In the past six months, have you accidently leaked urine? N  Do you have problems with loss of bowel control? N  Managing your Medications? N  Managing your Finances? N  Housekeeping or managing your Housekeeping? N  Some recent data might be hidden    Patient Care Team: Blane Ohara, MD as PCP - General (Family Medicine) Earvin Hansen, [facility] (Inactive) as Pharmacist (Pharmacist)    Assessment:   This is a routine wellness  examination for Austin Atkins.  Hearing/Vision screen No results found.  Dietary issues and exercise activities discussed: Current Exercise Habits: Home exercise routine, Type of exercise: Other - see comments, Time (Minutes): 45, Frequency (Times/Week): 4, Weekly Exercise (Minutes/Week): 180, Intensity: Moderate, Exercise limited by: None identified   Depression Screen PHQ 2/9 Scores 10/21/2020 03/10/2019  PHQ - 2 Score 0 0    Fall Risk Fall Risk  10/21/2020 03/10/2019  Falls in the past year? 0 1  Number falls in past yr: 0 0  Injury with Fall? 0 0  Risk for fall due to : No Fall Risks -  Follow up Falls evaluation completed Falls prevention discussed   Cognitive Function:     6CIT Screen 10/21/2020  What Year? 0 points  What month? 0  points  What time? 0 points  Count back from 20 0 points  Months in reverse 0 points  Repeat phrase 0 points  Total Score 0    Immunizations Immunization History  Administered Date(s) Administered   Fluad Quad(high Dose 65+) 09/11/2018, 10/06/2019   Influenza-Unspecified 10/02/2017   Moderna SARS-COV2 Booster Vaccination 11/04/2019, 07/13/2020   Moderna Sars-Covid-2 Vaccination 01/23/2019, 02/26/2019   Pneumococcal Conjugate-13 08/15/2016   Pneumococcal Polysaccharide-23 12/07/2010   Td 12/07/2010   Zoster Recombinat (Shingrix) 09/11/2018, 11/20/2018    TDAP status: Up to date  Flu Vaccine status: Completed at today's visit  Pneumococcal vaccine status: Completed during today's visit.  Covid-19 vaccine status: Information provided on how to obtain vaccines.   Screening Tests Health Maintenance  Topic Date Due   Pneumonia Vaccine 59+ Years old (3) 08/15/2017   INFLUENZA VACCINE  08/02/2020   COVID-19 Vaccine (3 - Booster for Moderna series) 09/07/2020   TETANUS/TDAP  12/06/2020   COLONOSCOPY (Pts 45-64yrs Insurance coverage will need to be confirmed)  03/31/2026   Hepatitis C Screening  Completed   Zoster Vaccines- Shingrix   Completed   HPV VACCINES  Aged Out    Health Maintenance  Health Maintenance Due  Topic Date Due   Pneumonia Vaccine 40+ Years old (3) 08/15/2017   INFLUENZA VACCINE  08/02/2020   COVID-19 Vaccine (3 - Booster for Moderna series) 09/07/2020    Colorectal cancer screening: Type of screening: Colonoscopy. Completed 2018. Repeat every 10 years  Lung Cancer Screening: (Low Dose CT Chest recommended if Age 76-80 years, 30 pack-year currently smoking OR have quit w/in 15years.) does not qualify.    Additional Screening:   Vision Screening: Recommended annual ophthalmology exams for early detection of glaucoma and other disorders of the eye. Is the patient up to date with their annual eye exam?  Yes    Dental Screening: Recommended annual dental exams for proper oral hygiene    Plan:    1- Flu Vaccine and PNA Vaccine completed today 2- COVID Booster recommended, patient will come back in a few weeks to get that 3- Labs ordered today (OK per Dr Sedalia Muta): CBC, CMP, FLP, PSA -- patient will follow up with provider for results and medication management  I have personally reviewed and noted the following in the patient's chart:   Medical and social history Use of alcohol, tobacco or illicit drugs  Current medications and supplements including opioid prescriptions. Patient is not currently taking opioid prescriptions. Functional ability and status Nutritional status Physical activity Advanced directives List of other physicians Hospitalizations, surgeries, and ER visits in previous 12 months Vitals Screenings to include cognitive, depression, and falls Referrals and appointments  In addition, I have reviewed and discussed with patient certain preventive protocols, quality metrics, and best practice recommendations. A written personalized care plan for preventive services as well as general preventive health recommendations were provided to patient.     Jacklynn Bue,  LPN   06/25/7626

## 2020-10-21 NOTE — Patient Instructions (Signed)

## 2020-10-22 LAB — CBC WITH DIFFERENTIAL/PLATELET
Basophils Absolute: 0 10*3/uL (ref 0.0–0.2)
Basos: 0 %
EOS (ABSOLUTE): 0.1 10*3/uL (ref 0.0–0.4)
Eos: 2 %
Hematocrit: 43.7 % (ref 37.5–51.0)
Hemoglobin: 15.2 g/dL (ref 13.0–17.7)
Immature Grans (Abs): 0 10*3/uL (ref 0.0–0.1)
Immature Granulocytes: 0 %
Lymphocytes Absolute: 1.8 10*3/uL (ref 0.7–3.1)
Lymphs: 32 %
MCH: 30.3 pg (ref 26.6–33.0)
MCHC: 34.8 g/dL (ref 31.5–35.7)
MCV: 87 fL (ref 79–97)
Monocytes Absolute: 0.4 10*3/uL (ref 0.1–0.9)
Monocytes: 6 %
Neutrophils Absolute: 3.4 10*3/uL (ref 1.4–7.0)
Neutrophils: 60 %
Platelets: 195 10*3/uL (ref 150–450)
RBC: 5.01 x10E6/uL (ref 4.14–5.80)
RDW: 12.4 % (ref 11.6–15.4)
WBC: 5.7 10*3/uL (ref 3.4–10.8)

## 2020-10-22 LAB — COMPREHENSIVE METABOLIC PANEL
ALT: 31 IU/L (ref 0–44)
AST: 25 IU/L (ref 0–40)
Albumin/Globulin Ratio: 2.6 — ABNORMAL HIGH (ref 1.2–2.2)
Albumin: 4.4 g/dL (ref 3.7–4.7)
Alkaline Phosphatase: 100 IU/L (ref 44–121)
BUN/Creatinine Ratio: 18 (ref 10–24)
BUN: 15 mg/dL (ref 8–27)
Bilirubin Total: 0.8 mg/dL (ref 0.0–1.2)
CO2: 25 mmol/L (ref 20–29)
Calcium: 9.4 mg/dL (ref 8.6–10.2)
Chloride: 104 mmol/L (ref 96–106)
Creatinine, Ser: 0.85 mg/dL (ref 0.76–1.27)
Globulin, Total: 1.7 g/dL (ref 1.5–4.5)
Glucose: 99 mg/dL (ref 70–99)
Potassium: 4.9 mmol/L (ref 3.5–5.2)
Sodium: 143 mmol/L (ref 134–144)
Total Protein: 6.1 g/dL (ref 6.0–8.5)
eGFR: 92 mL/min/{1.73_m2} (ref >59)

## 2020-10-22 LAB — LIPID PANEL
Chol/HDL Ratio: 2.8 ratio (ref 0.0–5.0)
Cholesterol, Total: 136 mg/dL (ref 100–199)
HDL: 48 mg/dL (ref >39)
LDL Chol Calc (NIH): 73 mg/dL (ref 0–99)
Triglycerides: 75 mg/dL (ref 0–149)
VLDL Cholesterol Cal: 15 mg/dL (ref 5–40)

## 2020-10-22 LAB — CARDIOVASCULAR RISK ASSESSMENT

## 2020-10-22 LAB — PSA: Prostate Specific Ag, Serum: 2.5 ng/mL (ref 0.0–4.0)

## 2020-11-05 ENCOUNTER — Ambulatory Visit: Payer: Medicare PPO | Admitting: Family Medicine

## 2020-11-05 ENCOUNTER — Encounter: Payer: Self-pay | Admitting: Family Medicine

## 2020-11-05 VITALS — BP 146/82 | HR 78 | Temp 97.4°F | Ht 69.0 in | Wt 200.0 lb

## 2020-11-05 DIAGNOSIS — J018 Other acute sinusitis: Secondary | ICD-10-CM | POA: Diagnosis not present

## 2020-11-05 DIAGNOSIS — J028 Acute pharyngitis due to other specified organisms: Secondary | ICD-10-CM | POA: Diagnosis not present

## 2020-11-05 LAB — POC COVID19 BINAXNOW: SARS Coronavirus 2 Ag: NEGATIVE

## 2020-11-05 MED ORDER — AMOXICILLIN 875 MG PO TABS: 875.0000 mg | ORAL_TABLET | Freq: Two times a day (BID) | ORAL | 0 refills | Status: AC

## 2020-11-05 NOTE — Progress Notes (Signed)
Acute Office Visit  Subjective:    Patient ID: Austin Atkins, male    DOB: 03-07-1945, 75 y.o.   MRN: [id number]  Chief Complaint  Patient presents with   Sore Throat   Patient is in today for sore throat x 2 days. Scratchy throat, nasal congestion, little cough.   Sore Throat  Associated symptoms include coughing. Pertinent negatives include no abdominal pain, congestion, diarrhea, ear pain, headaches, shortness of breath or vomiting.    Past Medical History:  Diagnosis Date   Atherosclerosis of aorta (HCC)    CAD (coronary artery disease)    Erectile dysfunction    High blood pressure    High cholesterol    Other acute pancreatitis without necrosis or infection    Prediabetes     Past Surgical History:  Procedure Laterality Date   HERNIA REPAIR      Family History  Problem Relation Age of Onset   Cancer Mother    Heart failure Father    Kidney Stones Father    Hypertension Other    Cancer Other     Social History   Socioeconomic History   Marital status: Married    Spouse name: Marlowe Kays   Number of children: 1   Years of education: Not on file   Highest education level: Not on file  Occupational History   Occupation: Engineer, site: [employer]  Tobacco Use   Smoking status: Former    Types: Cigarettes    Quit date: 1980    Years since quitting: 42.8   Smokeless tobacco: Never  Vaping Use   Vaping Use: Never used  Substance and Sexual Activity   Alcohol use: Not Currently    Alcohol/week: 2.0 standard drinks    Types: 1 Glasses of wine, 1 Cans of beer per week   Drug use: Never   Sexual activity: Not Currently  Other Topics Concern   Not on file  Social History Narrative   Not on file   Social Determinants of Health   Financial Resource Strain: Low Risk    Difficulty of Paying Living Expenses: Not hard at all  Food Insecurity: No Food Insecurity   Worried About Charity fundraiser in the Last Year: Never true   Ran Out  of Food in the Last Year: Never true  Transportation Needs: No Transportation Needs   Lack of Transportation (Medical): No   Lack of Transportation (Non-Medical): No  Physical Activity: Sufficiently Active   Days of Exercise per Week: 4 days   Minutes of Exercise per Session: 40 min  Stress: Not on file  Social Connections: Not on file  Intimate Partner Violence: Not on file    Outpatient Medications Prior to Visit  Medication Sig Dispense Refill   Ascorbic Acid (VITAMIN C) 1000 MG tablet Take 1,000 mg by mouth daily.     aspirin EC 81 MG tablet Take 81 mg by mouth daily.     meloxicam (MOBIC) 7.5 MG tablet TAKE 1 TABLET IN THE MORNING AND AT BEDTIME 180 tablet 1   Multiple Vitamin (MULTIVITAMIN) capsule Take 1 capsule by mouth daily.     Omega-3 Fatty Acids (FISH OIL) 1000 MG CAPS Take by mouth daily.      simvastatin (ZOCOR) 10 MG tablet TAKE 1 TABLET AT BEDTIME 90 tablet 1   valsartan-hydrochlorothiazide (DIOVAN-HCT) 160-25 MG tablet TAKE 1 TABLET EVERY DAY 90 tablet 1   No facility-administered medications prior to visit.    No Known  Allergies  Review of Systems  Constitutional:  Negative for appetite change, fatigue and fever.  HENT:  Positive for postnasal drip and sore throat. Negative for congestion and ear pain.   Respiratory:  Positive for cough. Negative for shortness of breath.   Cardiovascular:  Negative for chest pain and leg swelling.  Gastrointestinal:  Negative for abdominal pain, constipation, diarrhea, nausea and vomiting.  Genitourinary:  Negative for dysuria and frequency.  Musculoskeletal:  Negative for arthralgias and myalgias.  Neurological:  Negative for dizziness and headaches.  Psychiatric/Behavioral:  Negative for dysphoric mood. The patient is not nervous/anxious.       Objective:    Physical Exam Vitals reviewed.  Constitutional:      Appearance: Normal appearance. He is well-developed.  HENT:     Right Ear: Tympanic membrane, ear canal and  external ear normal.     Left Ear: Tympanic membrane, ear canal and external ear normal.     Nose: Congestion (left turbinates edematous and erythematous.) present. No rhinorrhea.     Mouth/Throat:     Mouth: Mucous membranes are moist.     Pharynx: Posterior oropharyngeal erythema present. No oropharyngeal exudate.  Cardiovascular:     Rate and Rhythm: Normal rate and regular rhythm.     Heart sounds: Normal heart sounds.  Pulmonary:     Effort: Pulmonary effort is normal. No respiratory distress.     Breath sounds: Normal breath sounds. No wheezing, rhonchi or rales.  Lymphadenopathy:     Cervical: No cervical adenopathy.  Neurological:     Mental Status: He is alert.  Psychiatric:        Mood and Affect: Mood normal.        Behavior: Behavior normal.    BP (!) 146/82 (BP Location: Right Arm, Patient Position: Sitting)   Pulse 78   Temp (!) 97.4 F (36.3 C) (Temporal)   Ht $R'5\' 9"'qO$  (1.753 m)   Wt 200 lb (90.7 kg)   SpO2 97%   BMI 29.53 kg/m  Wt Readings from Last 3 Encounters:  11/05/20 200 lb (90.7 kg)  10/21/20 197 lb 12.8 oz (89.7 kg)  03/22/20 181 lb (82.1 kg)    Health Maintenance Due  Topic Date Due   COVID-19 Vaccine (3 - Booster for Moderna series) 09/07/2020    There are no preventive care reminders to display for this patient.   Lab Results  Component Value Date   TSH 2.680 03/10/2019   Lab Results  Component Value Date   WBC 5.7 10/21/2020   HGB 15.2 10/21/2020   HCT 43.7 10/21/2020   MCV 87 10/21/2020   PLT 195 10/21/2020   Lab Results  Component Value Date   NA 143 10/21/2020   K 4.9 10/21/2020   CO2 25 10/21/2020   GLUCOSE 99 10/21/2020   BUN 15 10/21/2020   CREATININE 0.85 10/21/2020   BILITOT 0.8 10/21/2020   ALKPHOS 100 10/21/2020   AST 25 10/21/2020   ALT 31 10/21/2020   PROT 6.1 10/21/2020   ALBUMIN 4.4 10/21/2020   CALCIUM 9.4 10/21/2020   EGFR 92 10/21/2020   Lab Results  Component Value Date   CHOL 136 10/21/2020   Lab  Results  Component Value Date   HDL 48 10/21/2020   Lab Results  Component Value Date   LDLCALC 73 10/21/2020   Lab Results  Component Value Date   TRIG 75 10/21/2020   Lab Results  Component Value Date   CHOLHDL 2.8 10/21/2020   Lab  Results  Component Value Date   HGBA1C 6.1 (H) 10/06/2019         Assessment & Plan:   Problem List Items Addressed This Visit       Respiratory   Sinusitis    amoxicillin      Relevant Medications   amoxicillin (AMOXIL) 875 MG tablet   Other Relevant Orders   POC COVID-19 BinaxNow (Completed)   Pharyngitis - Primary    Amoxicillin. Gargle warm water.         I, Lauren Peterson [place]ard as a scribe for Rochel Brome, MD.,have documented all relevant documentation on the behalf of Rochel Brome, MD,as directed by  Rochel Brome, MD while in the presence of Rochel Brome, MD.   Rochel Brome, MD

## 2020-11-13 DIAGNOSIS — J029 Acute pharyngitis, unspecified: Secondary | ICD-10-CM | POA: Insufficient documentation

## 2020-11-13 NOTE — Assessment & Plan Note (Addendum)
Amoxicillin. Gargle warm water.

## 2020-11-13 NOTE — Assessment & Plan Note (Signed)
amoxicillin 

## 2020-11-15 ENCOUNTER — Telehealth: Payer: Self-pay

## 2020-11-15 NOTE — Chronic Care Management (AMB) (Signed)
Chronic Care Management Pharmacy Assistant   Name: Austin Atkins  MRN: [id number] DOB: 05/20/1945   Reason for Encounter: Disease State call for HTN   Recent office visits:  11/15/20 Blane Ohara MD. Seen for sore throat. Started on Amoxicillin 875 mg 2 times daily.   10/21/20 Caro Hight LPN. Medicare Annual Wellness. Completed course of Amoxicillin 875 mg, Cheratussin 100-10 mg/5 ml, and prednisone 10 mg.  Recent consult visits:  None   Hospital visits:  None   Medications: Outpatient Encounter Medications as of 11/15/2020  Medication Sig   amoxicillin (AMOXIL) 875 MG tablet Take 1 tablet (875 mg total) by mouth 2 (two) times daily for 10 days.   Ascorbic Acid (VITAMIN C) 1000 MG tablet Take 1,000 mg by mouth daily.   aspirin EC 81 MG tablet Take 81 mg by mouth daily.   meloxicam (MOBIC) 7.5 MG tablet TAKE 1 TABLET IN THE MORNING AND AT BEDTIME   Multiple Vitamin (MULTIVITAMIN) capsule Take 1 capsule by mouth daily.   Omega-3 Fatty Acids (FISH OIL) 1000 MG CAPS Take by mouth daily.    simvastatin (ZOCOR) 10 MG tablet TAKE 1 TABLET AT BEDTIME   valsartan-hydrochlorothiazide (DIOVAN-HCT) 160-25 MG tablet TAKE 1 TABLET EVERY DAY   No facility-administered encounter medications on file as of 11/15/2020.     Recent Office Vitals: BP Readings from Last 3 Encounters:  11/05/20 (!) 146/82  03/22/20 132/90  11/12/19 116/70   Pulse Readings from Last 3 Encounters:  11/05/20 78  10/21/20 72  03/22/20 80    Wt Readings from Last 3 Encounters:  11/05/20 200 lb (90.7 kg)  10/21/20 197 lb 12.8 oz (89.7 kg)  03/22/20 181 lb (82.1 kg)     Kidney Function Lab Results  Component Value Date/Time   CREATININE 0.85 10/21/2020 11:44 AM   CREATININE 0.82 10/06/2019 09:36 AM   GFRNONAA 89 10/06/2019 09:36 AM   GFRAA 103 10/06/2019 09:36 AM    BMP Latest Ref Rng & Units 10/21/2020 10/06/2019 03/10/2019  Glucose 70 - 99 mg/dL 99 888(B) 169(I)  BUN 8 - 27 mg/dL 15 13 17    Creatinine 0.76 - 1.27 mg/dL 5.03 8.88  BUN/Creat Ratio 10 - 24 18 16 20   Sodium 134 - 144 mmol/L 143 143 141  Potassium 3.5 - 5.2 mmol/L 4.9 4.2 4.3  Chloride 96 - 106 mmol/L 104 104 101  CO2 20 - 29 mmol/L 25 25 -  Calcium 8.6 - 10.2 mg/dL 9.4 9.8 9.7     Current antihypertensive regimen:  Valsartan-HCTZ 160-25 mg daily  Patient verbally confirms he is taking the above medications as directed. Yes  How often are you checking your Blood Pressure? 1-2x per week  he checks his blood pressure in the morning before taking his medication.  Current home BP readings: 10/22/20 120/84 10/23/20 135/81 10/29/20 132/89 11/10/20 (151/91)- Pt was sick doing this high reading  Wrist or arm cuff:Arm  Caffeine intake:coffee  Salt intake:Limited  OTC medications including pseudoephedrine or NSAIDs? Not currently   Any readings above 180/120? No   What recent interventions/DTPs have been made by any provider to improve Blood Pressure control since last CPP Visit: No changes   Any recent hospitalizations or ED visits since last visit with CPP? No recent visits   What diet changes have been made to improve Blood Pressure Control?  Pt stated no changes.   What exercise is being done to improve your Blood Pressure Control?  Pt exercises daily  Adherence Review: Is the patient currently on ACE/ARB medication? Yes Does the patient have >5 day gap between last estimated fill dates? No   Care Gaps: Last annual wellness visit? 10/21/20  Star Rating Drugs:  Medication:  Last Fill: Day Supply Valsartan-HCTZ 08/21/20 90ds  Roxana Hires, CMA Clinical Pharmacist Assistant  [phone]

## 2020-12-08 ENCOUNTER — Telehealth: Payer: Self-pay

## 2020-12-08 NOTE — Progress Notes (Signed)
    Chronic Care Management Pharmacy Assistant   Name: Austin Atkins  MRN: [id number] DOB: 1945-06-21  Reason for Encounter: Disease State/ General Adherence Call   Conditions to be addressed/monitored:General    Recent office visits: None  Recent consult visits: None  Hospital visits:  None in previous 6 months  Medications: Outpatient Encounter Medications as of 12/08/2020  Medication Sig   Ascorbic Acid (VITAMIN C) 1000 MG tablet Take 1,000 mg by mouth daily.   aspirin EC 81 MG tablet Take 81 mg by mouth daily.   meloxicam (MOBIC) 7.5 MG tablet TAKE 1 TABLET IN THE MORNING AND AT BEDTIME   Multiple Vitamin (MULTIVITAMIN) capsule Take 1 capsule by mouth daily.   Omega-3 Fatty Acids (FISH OIL) 1000 MG CAPS Take by mouth daily.    simvastatin (ZOCOR) 10 MG tablet TAKE 1 TABLET AT BEDTIME   valsartan-hydrochlorothiazide (DIOVAN-HCT) 160-25 MG tablet TAKE 1 TABLET EVERY DAY   No facility-administered encounter medications on file as of 12/08/2020.  Contacted [name] for general disease state and medication adherence call.   Patient is > 5 days past due for refill on the following medications per chart history:  What concerns do you have about your medications?  The patient denies side effects with his medications.   How often do you forget or accidentally miss a dose? Never  Do you use a pillbox? Yes  Are you having any problems getting your medications from your pharmacy? No  Has the cost of your medications been a concern? No If yes, what medication and is patient assistance available or has it been applied for?  Since last visit with CPP, no interventions have been made:   The patient has not had an ED visit since last contact.   The patient denies problems with their health.   he denies  concerns or questions for [name], at this time.   Patient states BP and BG readings are averaging 120 fasting patient only checks BP and BG once a week. Reading  are normal.  DATE:             BP               PULSE  12/07/20             136/75              68   Care Gaps:  Last annual wellness visit? 10/21/20  Star Rating Drugs:  Valsartan-HCTZ         09/19/20            90ds   [facility] Clinical Pharmacist Assistant [phone]

## 2020-12-15 ENCOUNTER — Ambulatory Visit (INDEPENDENT_AMBULATORY_CARE_PROVIDER_SITE_OTHER): Payer: Medicare PPO

## 2020-12-15 DIAGNOSIS — Z23 Encounter for immunization: Secondary | ICD-10-CM

## 2021-02-24 ENCOUNTER — Ambulatory Visit (HOSPITAL_BASED_OUTPATIENT_CLINIC_OR_DEPARTMENT_OTHER)
Admission: RE | Admit: 2021-02-24 | Discharge: 2021-02-24 | Disposition: A | Discharge status: Home / Self Care | Payer: Medicare PPO | Source: Ambulatory Visit | Attending: Family Medicine | Admitting: Family Medicine

## 2021-02-24 ENCOUNTER — Other Ambulatory Visit: Payer: Self-pay

## 2021-02-24 ENCOUNTER — Encounter (HOSPITAL_BASED_OUTPATIENT_CLINIC_OR_DEPARTMENT_OTHER): Payer: Self-pay

## 2021-02-24 ENCOUNTER — Ambulatory Visit: Payer: Medicare PPO | Admitting: Family Medicine

## 2021-02-24 VITALS — BP 110/72 | HR 68 | Temp 97.0°F | Resp 16 | Ht 68.5 in | Wt 189.0 lb

## 2021-02-24 DIAGNOSIS — M79604 Pain in right leg: Secondary | ICD-10-CM | POA: Insufficient documentation

## 2021-02-24 DIAGNOSIS — M79605 Pain in left leg: Secondary | ICD-10-CM | POA: Diagnosis not present

## 2021-02-24 DIAGNOSIS — M87051 Idiopathic aseptic necrosis of right femur: Secondary | ICD-10-CM | POA: Insufficient documentation

## 2021-02-24 DIAGNOSIS — M2578 Osteophyte, vertebrae: Secondary | ICD-10-CM | POA: Diagnosis not present

## 2021-02-24 DIAGNOSIS — M545 Low back pain, unspecified: Secondary | ICD-10-CM

## 2021-02-24 DIAGNOSIS — M8588 Other specified disorders of bone density and structure, other site: Secondary | ICD-10-CM | POA: Diagnosis not present

## 2021-02-24 DIAGNOSIS — M25552 Pain in left hip: Secondary | ICD-10-CM | POA: Diagnosis not present

## 2021-02-24 DIAGNOSIS — M87052 Idiopathic aseptic necrosis of left femur: Secondary | ICD-10-CM | POA: Insufficient documentation

## 2021-02-24 NOTE — Progress Notes (Signed)
Acute Office Visit  Subjective:    Patient ID: Austin Atkins, male    DOB: 04-04-1945, 76 y.o.   MRN: [id number]  Chief Complaint  Patient presents with   Leg Pain    HPI: Patient is in today for leg pain in back of both legs. Improves if leans over. Saw PHYSICAL THERAPY for about 3 months for pain. Helped for a while. Continued doing home exercises Worsening in the last 3 months. Wears sports tape on the back of the thigh. Meloxicam 7.5 mg 2 daily. Tylenol.   Past Medical History:  Diagnosis Date   Atherosclerosis of aorta (HCC)    CAD (coronary artery disease)    Erectile dysfunction    High blood pressure    High cholesterol    Other acute pancreatitis without necrosis or infection    Prediabetes     Past Surgical History:  Procedure Laterality Date   HERNIA REPAIR      Family History  Problem Relation Age of Onset   Cancer Mother    Heart failure Father    Kidney Stones Father    Hypertension Other    Cancer Other     Social History   Socioeconomic History   Marital status: Married    Spouse name: Marlowe Kays   Number of children: 1   Years of education: Not on file   Highest education level: Not on file  Occupational History   Occupation: Engineer, site: [employer]  Tobacco Use   Smoking status: Former    Types: Cigarettes    Quit date: 1980    Years since quitting: 43.1   Smokeless tobacco: Never  Vaping Use   Vaping Use: Never used  Substance and Sexual Activity   Alcohol use: Not Currently    Alcohol/week: 2.0 standard drinks    Types: 1 Glasses of wine, 1 Cans of beer per week   Drug use: Never   Sexual activity: Not Currently  Other Topics Concern   Not on file  Social History Narrative   Not on file   Social Determinants of Health   Financial Resource Strain: Low Risk    Difficulty of Paying Living Expenses: Not hard at all  Food Insecurity: No Food Insecurity   Worried About Charity fundraiser in the Last Year:  Never true   Ran Out of Food in the Last Year: Never true  Transportation Needs: No Transportation Needs   Lack of Transportation (Medical): No   Lack of Transportation (Non-Medical): No  Physical Activity: Sufficiently Active   Days of Exercise per Week: 4 days   Minutes of Exercise per Session: 40 min  Stress: Not on file  Social Connections: Not on file  Intimate Partner Violence: Not on file    Outpatient Medications Prior to Visit  Medication Sig Dispense Refill   Ascorbic Acid (VITAMIN C) 1000 MG tablet Take 1,000 mg by mouth daily.     aspirin EC 81 MG tablet Take 81 mg by mouth daily.     meloxicam (MOBIC) 7.5 MG tablet TAKE 1 TABLET IN THE MORNING AND AT BEDTIME 180 tablet 1   Multiple Vitamin (MULTIVITAMIN) capsule Take 1 capsule by mouth daily.     Omega-3 Fatty Acids (FISH OIL) 1000 MG CAPS Take by mouth daily.      simvastatin (ZOCOR) 10 MG tablet TAKE 1 TABLET AT BEDTIME 90 tablet 1   valsartan-hydrochlorothiazide (DIOVAN-HCT) 160-25 MG tablet TAKE 1 TABLET EVERY DAY 90 tablet  1   No facility-administered medications prior to visit.    No Known Allergies  Review of Systems  Constitutional:  Negative for chills and fever.  HENT:  Negative for congestion, rhinorrhea and sore throat.   Respiratory:  Negative for cough and shortness of breath.   Cardiovascular:  Negative for chest pain and palpitations.  Gastrointestinal:  Negative for abdominal pain, constipation, diarrhea, nausea and vomiting.  Genitourinary:  Negative for dysuria and urgency.  Musculoskeletal:  Positive for myalgias. Negative for arthralgias and back pain.  Neurological:  Negative for dizziness and headaches.  Psychiatric/Behavioral:  Negative for dysphoric mood. The patient is not nervous/anxious.       Objective:    Physical Exam Vitals reviewed.  Constitutional:      Appearance: Normal appearance.  Neck:     Vascular: No carotid bruit.  Cardiovascular:     Rate and Rhythm: Normal rate  and regular rhythm.     Heart sounds: Normal heart sounds.  Pulmonary:     Effort: Pulmonary effort is normal.     Breath sounds: Normal breath sounds. No wheezing, rhonchi or rales.  Abdominal:     General: Bowel sounds are normal.     Palpations: Abdomen is soft.     Tenderness: There is no abdominal tenderness.  Musculoskeletal:        General: Tenderness (lumbar. negative SLR BL. FROM of hips, but discomfort. nontender over trochanteric bursa BL.) present.  Neurological:     Mental Status: He is alert.  Psychiatric:        Mood and Affect: Mood normal.        Behavior: Behavior normal.    BP 110/72    Pulse 68    Temp (!) 97 F (36.1 C)    Resp 16    Ht 5' 8.5" (1.74 m)    Wt 189 lb (85.7 kg)    BMI 28.32 kg/m  Wt Readings from Last 3 Encounters:  02/24/21 189 lb (85.7 kg)  11/05/20 200 lb (90.7 kg)  10/21/20 197 lb 12.8 oz (89.7 kg)    Health Maintenance Due  Topic Date Due   TETANUS/TDAP  12/06/2020    There are no preventive care reminders to display for this patient.   Lab Results  Component Value Date   TSH 2.680 03/10/2019   Lab Results  Component Value Date   WBC 5.7 10/21/2020   HGB 15.2 10/21/2020   HCT 43.7 10/21/2020   MCV 87 10/21/2020   PLT 195 10/21/2020   Lab Results  Component Value Date   NA 143 10/21/2020   K 4.9 10/21/2020   CO2 25 10/21/2020   GLUCOSE 99 10/21/2020   BUN 15 10/21/2020   CREATININE 0.85 10/21/2020   BILITOT 0.8 10/21/2020   ALKPHOS 100 10/21/2020   AST 25 10/21/2020   ALT 31 10/21/2020   PROT 6.1 10/21/2020   ALBUMIN 4.4 10/21/2020   CALCIUM 9.4 10/21/2020   EGFR 92 10/21/2020   Lab Results  Component Value Date   CHOL 136 10/21/2020   Lab Results  Component Value Date   HDL 48 10/21/2020   Lab Results  Component Value Date   LDLCALC 73 10/21/2020   Lab Results  Component Value Date   TRIG 75 10/21/2020   Lab Results  Component Value Date   CHOLHDL 2.8 10/21/2020   Lab Results  Component Value  Date   HGBA1C 6.1 (H) 10/06/2019       Assessment & Plan:  Problem List Items Addressed This Visit       Musculoskeletal and Integument   Avascular necrosis of bones of both hips (HCC)    Diagnostic Xray of hips and pelvis was ordered.        Other   Lumbar pain - Primary    Ordered diagnostic lumbar complete Xray.      Relevant Orders   DG Lumbar Spine Complete (Completed)   DG HIPS BILAT WITH PELVIS MIN 5 VIEWS (Completed)   Leg pain, bilateral    Awaiting for Xray for treatment and recommendations.      Relevant Orders   DG HIPS BILAT WITH PELVIS MIN 5 VIEWS (Completed)   No orders of the defined types were placed in this encounter.   Orders Placed This Encounter  Procedures   DG Lumbar Spine Complete   DG HIPS BILAT WITH PELVIS MIN 5 VIEWS    Follow-up: Return in about 1 week (around 03/03/2021).  An After Visit Summary was printed and given to the patient.  Rochel Brome, MD [facility] [phone]

## 2021-02-27 DIAGNOSIS — M79605 Pain in left leg: Secondary | ICD-10-CM | POA: Insufficient documentation

## 2021-02-27 DIAGNOSIS — M545 Low back pain, unspecified: Secondary | ICD-10-CM | POA: Insufficient documentation

## 2021-02-27 DIAGNOSIS — M79604 Pain in right leg: Secondary | ICD-10-CM | POA: Insufficient documentation

## 2021-02-27 NOTE — Assessment & Plan Note (Signed)
Diagnostic Xray of hips and pelvis was ordered.

## 2021-02-27 NOTE — Assessment & Plan Note (Signed)
Awaiting for Xray for treatment and recommendations.

## 2021-02-27 NOTE — Assessment & Plan Note (Signed)
Ordered diagnostic lumbar complete Xray.

## 2021-03-01 ENCOUNTER — Encounter: Payer: Self-pay | Admitting: Family Medicine

## 2021-03-01 ENCOUNTER — Ambulatory Visit: Payer: Medicare PPO | Admitting: Family Medicine

## 2021-03-01 ENCOUNTER — Other Ambulatory Visit: Payer: Self-pay

## 2021-03-01 VITALS — BP 130/78 | HR 84 | Temp 97.0°F | Ht 68.5 in | Wt 193.0 lb

## 2021-03-01 DIAGNOSIS — M87051 Idiopathic aseptic necrosis of right femur: Secondary | ICD-10-CM

## 2021-03-01 DIAGNOSIS — M4716 Other spondylosis with myelopathy, lumbar region: Secondary | ICD-10-CM

## 2021-03-01 DIAGNOSIS — M79604 Pain in right leg: Secondary | ICD-10-CM | POA: Diagnosis not present

## 2021-03-01 DIAGNOSIS — M79605 Pain in left leg: Secondary | ICD-10-CM | POA: Diagnosis not present

## 2021-03-01 DIAGNOSIS — M8588 Other specified disorders of bone density and structure, other site: Secondary | ICD-10-CM | POA: Diagnosis not present

## 2021-03-01 HISTORY — DX: Other spondylosis with myelopathy, lumbar region: M47.16

## 2021-03-01 HISTORY — DX: Idiopathic aseptic necrosis of right femur: M87.051

## 2021-03-01 NOTE — Progress Notes (Signed)
Acute Office Visit  Subjective:    Patient ID: Austin Atkins, male    DOB: 06-29-1945, 76 y.o.   MRN: [id number]  Chief Complaint  Patient presents with   Follow up on Lumbar Spine Xrays    HPI Patient is a 76 year old white male with worsening pain in his legs over the last 3 to 6 months.  Particularly with ambulation he has discomfort.  Patient had a history of some AVN of his hips in the past so I repeated hip x-rays.  Right hip x-ray showed right avascular necrosis with mild degenerative changes.  Lumbar x-ray showed degenerative disease, some wedge compression of vertebrae.  Some evidence of osteopenia.  Some scoliosis.  Takes meloxicam 7.5 mg daily. Saw PHYSICAL THERAPY for about 3 months and continued to do home exercises. Tylenol not helped.  Past Medical History:  Diagnosis Date   Atherosclerosis of aorta (HCC)    CAD (coronary artery disease)    Erectile dysfunction    High blood pressure    High cholesterol    Other acute pancreatitis without necrosis or infection    Prediabetes     Past Surgical History:  Procedure Laterality Date   HERNIA REPAIR      Family History  Problem Relation Age of Onset   Cancer Mother    Heart failure Father    Kidney Stones Father    Hypertension Other    Cancer Other     Social History   Socioeconomic History   Marital status: Married    Spouse name: Marlowe Kays   Number of children: 1   Years of education: Not on file   Highest education level: Not on file  Occupational History   Occupation: Engineer, site: [employer]  Tobacco Use   Smoking status: Former    Types: Cigarettes    Quit date: 1980    Years since quitting: 43.1   Smokeless tobacco: Never  Vaping Use   Vaping Use: Never used  Substance and Sexual Activity   Alcohol use: Not Currently    Alcohol/week: 2.0 standard drinks    Types: 1 Glasses of wine, 1 Cans of beer per week   Drug use: Never   Sexual activity: Not Currently  Other  Topics Concern   Not on file  Social History Narrative   Not on file   Social Determinants of Health   Financial Resource Strain: Low Risk    Difficulty of Paying Living Expenses: Not hard at all  Food Insecurity: No Food Insecurity   Worried About Charity fundraiser in the Last Year: Never true   Ran Out of Food in the Last Year: Never true  Transportation Needs: No Transportation Needs   Lack of Transportation (Medical): No   Lack of Transportation (Non-Medical): No  Physical Activity: Sufficiently Active   Days of Exercise per Week: 4 days   Minutes of Exercise per Session: 40 min  Stress: Not on file  Social Connections: Not on file  Intimate Partner Violence: Not on file    Outpatient Medications Prior to Visit  Medication Sig Dispense Refill   Ascorbic Acid (VITAMIN C) 1000 MG tablet Take 1,000 mg by mouth daily.     aspirin EC 81 MG tablet Take 81 mg by mouth daily.     meloxicam (MOBIC) 7.5 MG tablet TAKE 1 TABLET IN THE MORNING AND AT BEDTIME 180 tablet 1   Multiple Vitamin (MULTIVITAMIN) capsule Take 1 capsule by mouth daily.  Omega-3 Fatty Acids (FISH OIL) 1000 MG CAPS Take by mouth daily.      simvastatin (ZOCOR) 10 MG tablet TAKE 1 TABLET AT BEDTIME 90 tablet 1   valsartan-hydrochlorothiazide (DIOVAN-HCT) 160-25 MG tablet TAKE 1 TABLET EVERY DAY 90 tablet 1   No facility-administered medications prior to visit.    No Known Allergies  Review of Systems  Constitutional:  Negative for chills, fatigue and fever.  HENT:  Negative for congestion, ear pain and sore throat.   Respiratory:  Negative for cough and shortness of breath.   Cardiovascular:  Negative for chest pain and leg swelling.  Gastrointestinal:  Negative for abdominal pain, constipation, diarrhea, nausea and vomiting.  Genitourinary:  Negative for dysuria and frequency.  Musculoskeletal:  Positive for back pain (Low back pain - un changed) and myalgias (Aching thigh pain bilateral). Negative for  arthralgias.  Neurological:  Negative for dizziness and headaches.  Psychiatric/Behavioral:  Negative for dysphoric mood. The patient is not nervous/anxious.       Objective:    Physical Exam Vitals reviewed.  Constitutional:      Appearance: Normal appearance. He is normal weight.  Cardiovascular:     Pulses: Normal pulses.  Musculoskeletal:     Comments: Nontender over lumbar spine/paraspinal muscles. Discomfort with right hip flexion and external/internal rotation. When he laid down he lifts his head due to discomfort in legs.   Neurological:     Mental Status: He is alert and oriented to person, place, and time.     Deep Tendon Reflexes:     Reflex Scores:      Patellar reflexes are 2+ on the right side and 2+ on the left side. Psychiatric:        Mood and Affect: Mood normal.        Behavior: Behavior normal.    BP 130/78 (BP Location: Right Arm, Patient Position: Sitting)    Pulse 84    Temp (!) 97 F (36.1 C) (Oral)    Ht 5' 8.5" (1.74 m)    Wt 193 lb (87.5 kg)    SpO2 97%    BMI 28.92 kg/m  Wt Readings from Last 3 Encounters:  03/01/21 193 lb (87.5 kg)  02/24/21 189 lb (85.7 kg)  11/05/20 200 lb (90.7 kg)    Health Maintenance Due  Topic Date Due   TETANUS/TDAP  12/06/2020    There are no preventive care reminders to display for this patient.   Lab Results  Component Value Date   TSH 2.680 03/10/2019   Lab Results  Component Value Date   WBC 5.7 10/21/2020   HGB 15.2 10/21/2020   HCT 43.7 10/21/2020   MCV 87 10/21/2020   PLT 195 10/21/2020   Lab Results  Component Value Date   NA 143 10/21/2020   K 4.9 10/21/2020   CO2 25 10/21/2020   GLUCOSE 99 10/21/2020   BUN 15 10/21/2020   CREATININE 0.85 10/21/2020   BILITOT 0.8 10/21/2020   ALKPHOS 100 10/21/2020   AST 25 10/21/2020   ALT 31 10/21/2020   PROT 6.1 10/21/2020   ALBUMIN 4.4 10/21/2020   CALCIUM 9.4 10/21/2020   EGFR 92 10/21/2020   Lab Results  Component Value Date   CHOL 136  10/21/2020   Lab Results  Component Value Date   HDL 48 10/21/2020   Lab Results  Component Value Date   LDLCALC 73 10/21/2020   Lab Results  Component Value Date   TRIG 75 10/21/2020   Lab  Results  Component Value Date   CHOLHDL 2.8 10/21/2020   Lab Results  Component Value Date   HGBA1C 6.1 (H) 10/06/2019         Assessment & Plan:   Problem List Items Addressed This Visit       Nervous and Auditory   Osteoarthritis of lumbar spine with myelopathy     Musculoskeletal and Integument   Avascular necrosis of bone of right hip ([place]) - Primary   Relevant Orders   MR HIP RIGHT WO CONTRAST   Osteopenia of lumbar spine   Relevant Orders   DG Bone Density     Other   Leg pain, bilateral   Relevant Orders   MR Lumbar Spine Wo Contrast   Follow up: as needed   I,Lauren M Auman,acting as a scribe for Austin Brome, MD.,have documented all relevant documentation on the behalf of Austin Brome, MD,as directed by  Austin Brome, MD while in the presence of Austin Brome, MD.    Austin Brome, MD

## 2021-03-02 ENCOUNTER — Other Ambulatory Visit: Payer: Self-pay

## 2021-03-02 DIAGNOSIS — M8580 Other specified disorders of bone density and structure, unspecified site: Secondary | ICD-10-CM

## 2021-03-16 ENCOUNTER — Other Ambulatory Visit: Payer: Self-pay

## 2021-03-16 ENCOUNTER — Ambulatory Visit
Admission: RE | Admit: 2021-03-16 | Discharge: 2021-03-16 | Disposition: A | Discharge status: Home / Self Care | Payer: Medicare PPO | Source: Ambulatory Visit | Attending: Family Medicine | Admitting: Family Medicine

## 2021-03-16 DIAGNOSIS — M25551 Pain in right hip: Secondary | ICD-10-CM | POA: Diagnosis not present

## 2021-03-16 DIAGNOSIS — M4716 Other spondylosis with myelopathy, lumbar region: Secondary | ICD-10-CM

## 2021-03-16 DIAGNOSIS — M4316 Spondylolisthesis, lumbar region: Secondary | ICD-10-CM | POA: Diagnosis not present

## 2021-03-16 DIAGNOSIS — M48061 Spinal stenosis, lumbar region without neurogenic claudication: Secondary | ICD-10-CM | POA: Diagnosis not present

## 2021-03-16 DIAGNOSIS — M87051 Idiopathic aseptic necrosis of right femur: Secondary | ICD-10-CM

## 2021-03-16 DIAGNOSIS — M79604 Pain in right leg: Secondary | ICD-10-CM

## 2021-03-17 ENCOUNTER — Other Ambulatory Visit: Payer: Self-pay

## 2021-03-24 DIAGNOSIS — M5441 Lumbago with sciatica, right side: Secondary | ICD-10-CM | POA: Diagnosis not present

## 2021-03-24 DIAGNOSIS — M5442 Lumbago with sciatica, left side: Secondary | ICD-10-CM | POA: Diagnosis not present

## 2021-03-25 ENCOUNTER — Telehealth: Payer: Self-pay

## 2021-03-25 DIAGNOSIS — M48062 Spinal stenosis, lumbar region with neurogenic claudication: Secondary | ICD-10-CM | POA: Diagnosis not present

## 2021-03-25 NOTE — Chronic Care Management (AMB) (Signed)
? ? ?  Chronic Care Management ?Pharmacy Assistant  ? ?Name: Austin Atkins  MRN: [id number] DOB: 09/14/1945 ? ?Reason for Encounter: Disease State/ General ? ?Recent office visits:  ?03-01-2021 [facility] Maxwell, MD. MR HIP right wo contrast, MR lumbar spine wo contrast and DG mobile bone density ordered. ? ?02-24-2021 [facility] Maxwell, MD. DG hips bilat with pelvis min 5 views DG lumbar spine completed. ? ?12-15-2020 Covid booster given ? ?Recent consult visits:  ?None ? ?Hospital visits:  ?None in previous 6 months ? ?Medications: ?Outpatient Encounter Medications as of 03/25/2021  ?Medication Sig  ? Ascorbic Acid (VITAMIN C) 1000 MG tablet Take 1,000 mg by mouth daily.  ? aspirin EC 81 MG tablet Take 81 mg by mouth daily.  ? meloxicam (MOBIC) 7.5 MG tablet TAKE 1 TABLET IN THE MORNING AND AT BEDTIME  ? Multiple Vitamin (MULTIVITAMIN) capsule Take 1 capsule by mouth daily.  ? Omega-3 Fatty Acids (FISH OIL) 1000 MG CAPS Take by mouth daily.   ? simvastatin (ZOCOR) 10 MG tablet TAKE 1 TABLET AT BEDTIME  ? valsartan-hydrochlorothiazide (DIOVAN-HCT) 160-25 MG tablet TAKE 1 TABLET EVERY DAY  ? ?No facility-administered encounter medications on file as of 03/25/2021.  ? ? ? ?03-25-2021: 1st attempt left VM ?03-29-2021: 2nd attempt left VM ?03-30-2021: 3rd attempt left VM ? ?Star Rating Drugs: ?Valsartan/HCTZ 160-25 mg- Last filled 01-26-2021 90 DS ?Simvastatin 10 mg- Last filled 01-26-2021 90 DS ? ?Malecca Hicks CMA ?Clinical Pharmacist Assistant ?[phone] ? ?

## 2021-04-01 DIAGNOSIS — M48062 Spinal stenosis, lumbar region with neurogenic claudication: Secondary | ICD-10-CM | POA: Diagnosis not present

## 2021-04-07 ENCOUNTER — Ambulatory Visit
Admission: RE | Admit: 2021-04-07 | Discharge: 2021-04-07 | Disposition: A | Discharge status: Home / Self Care | Payer: Medicare PPO | Source: Ambulatory Visit | Attending: Family Medicine | Admitting: Family Medicine

## 2021-04-07 DIAGNOSIS — M8589 Other specified disorders of bone density and structure, multiple sites: Secondary | ICD-10-CM | POA: Diagnosis not present

## 2021-04-07 DIAGNOSIS — M8588 Other specified disorders of bone density and structure, other site: Secondary | ICD-10-CM

## 2021-04-07 DIAGNOSIS — M810 Age-related osteoporosis without current pathological fracture: Secondary | ICD-10-CM | POA: Diagnosis not present

## 2021-04-13 DIAGNOSIS — Z7982 Long term (current) use of aspirin: Secondary | ICD-10-CM | POA: Diagnosis not present

## 2021-04-13 DIAGNOSIS — I739 Peripheral vascular disease, unspecified: Secondary | ICD-10-CM | POA: Diagnosis not present

## 2021-04-13 DIAGNOSIS — Z8249 Family history of ischemic heart disease and other diseases of the circulatory system: Secondary | ICD-10-CM | POA: Diagnosis not present

## 2021-04-13 DIAGNOSIS — Z791 Long term (current) use of non-steroidal anti-inflammatories (NSAID): Secondary | ICD-10-CM | POA: Diagnosis not present

## 2021-04-13 DIAGNOSIS — N529 Male erectile dysfunction, unspecified: Secondary | ICD-10-CM | POA: Diagnosis not present

## 2021-04-13 DIAGNOSIS — M1990 Unspecified osteoarthritis, unspecified site: Secondary | ICD-10-CM | POA: Diagnosis not present

## 2021-04-13 DIAGNOSIS — Z809 Family history of malignant neoplasm, unspecified: Secondary | ICD-10-CM | POA: Diagnosis not present

## 2021-04-13 DIAGNOSIS — E785 Hyperlipidemia, unspecified: Secondary | ICD-10-CM | POA: Diagnosis not present

## 2021-04-13 DIAGNOSIS — I10 Essential (primary) hypertension: Secondary | ICD-10-CM | POA: Diagnosis not present

## 2021-04-14 DIAGNOSIS — M48062 Spinal stenosis, lumbar region with neurogenic claudication: Secondary | ICD-10-CM | POA: Diagnosis not present

## 2021-04-19 ENCOUNTER — Telehealth: Payer: Self-pay

## 2021-04-19 NOTE — Telephone Encounter (Signed)
Benefits verified for Prolia on 04/11/21.  PA required and denied by [facility], patient must have a history of osteoporotic fracture or has tried or cannot use zoledronic acid (step therapy requirement).  ?

## 2021-04-20 DIAGNOSIS — K649 Unspecified hemorrhoids: Secondary | ICD-10-CM | POA: Diagnosis not present

## 2021-04-21 ENCOUNTER — Other Ambulatory Visit: Payer: Medicare PPO

## 2021-05-02 DIAGNOSIS — M5416 Radiculopathy, lumbar region: Secondary | ICD-10-CM | POA: Diagnosis not present

## 2021-05-04 ENCOUNTER — Other Ambulatory Visit: Payer: Self-pay | Admitting: Orthopedic Surgery

## 2021-05-17 DIAGNOSIS — Z1211 Encounter for screening for malignant neoplasm of colon: Secondary | ICD-10-CM | POA: Diagnosis not present

## 2021-05-17 DIAGNOSIS — Z86010 Personal history of colonic polyps: Secondary | ICD-10-CM | POA: Diagnosis not present

## 2021-05-17 DIAGNOSIS — Z09 Encounter for follow-up examination after completed treatment for conditions other than malignant neoplasm: Secondary | ICD-10-CM | POA: Diagnosis not present

## 2021-05-17 DIAGNOSIS — K648 Other hemorrhoids: Secondary | ICD-10-CM | POA: Diagnosis not present

## 2021-05-17 DIAGNOSIS — K644 Residual hemorrhoidal skin tags: Secondary | ICD-10-CM | POA: Diagnosis not present

## 2021-05-17 DIAGNOSIS — Z800 Family history of malignant neoplasm of digestive organs: Secondary | ICD-10-CM | POA: Diagnosis not present

## 2021-05-17 LAB — HM COLONOSCOPY

## 2021-05-17 NOTE — Pre-Procedure Instructions (Signed)
Surgical Instructions ? ? ? Your procedure is scheduled on Wednesday, May 24th. ? Report to [facility] Main Entrance "A" at 5:30 A.M., then check in with the Admitting office. ? Call this number if you have problems the morning of surgery: ? [phone] ? ? If you have any questions prior to your surgery date call [phone]: Open Monday-Friday 8am-4pm ? ? ? Remember: ? Do not eat after midnight the night before your surgery ? ?You may drink clear liquids until 4:30 a.m. the morning of your surgery.   ?Clear liquids allowed are: Water, Non-Citrus Juices (without pulp), Carbonated Beverages, Clear Tea, Black Coffee ONLY (NO MILK, CREAM OR POWDERED CREAMER of any kind), and Gatorade. ? ? ?Enhanced Recovery after Surgery for Orthopedics ?Enhanced Recovery after Surgery is a protocol used to improve the stress on your body and your recovery after surgery. ? ?Patient Instructions ? ?The day of surgery (if you do NOT have diabetes):  ?Drink ONE (1) Pre-Surgery Clear Ensure by 4:30 am the morning of surgery   ?This drink was given to you during your hospital  ?pre-op appointment visit. ?Nothing else to drink after completing the  ?Pre-Surgery Clear Ensure. ? ?       If you have questions, please contact your surgeon?s office. ? ?  ? Take these medicines the morning of surgery with A SIP OF WATER:  ?gabapentin (NEURONTIN)  ? ?As needed: ?HYDROcodone-acetaminophen (NORCO/VICODIN) ? ?Follow your surgeon's instructions on when to stop Aspirin.  If no instructions were given by your surgeon then you will need to call the office to get those instructions.   ? ?As of today, STOP taking any Aleve, Naproxen, Ibuprofen, Motrin, Advil, Goody's, BC's, all herbal medications, fish oil, and all vitamins. This includes: meloxicam (MOBIC). ? ?         ?Do not wear jewelry ?Do not wear lotions, powders, colognes, or deodorant. ?Men may shave face and neck. ?Do not bring valuables to the hospital. ? ?[facility] is not responsible for  any belongings or valuables. .  ? ?Do NOT Smoke (Tobacco/Vaping)  24 hours prior to your procedure ? ?If you use a CPAP at night, you may bring your mask for your overnight stay. ?  ?Contacts, glasses, hearing aids, dentures or partials may not be worn into surgery, please bring cases for these belongings ?  ?For patients admitted to the hospital, discharge time will be determined by your treatment team. ?  ?Patients discharged the day of surgery will not be allowed to drive home, and someone needs to stay with them for 24 hours. ? ? ?SURGICAL WAITING ROOM VISITATION ?Patients having surgery or a procedure in a hospital may have two support people. ?Children under the age of 72 must have an adult with them who is not the patient. ?They may stay in the waiting area during the procedure and may switch out with other visitors. If the patient needs to stay at the hospital during part of their recovery, the visitor guidelines for inpatient rooms apply. ? ?Please refer to the [facility] website for the visitor guidelines for Inpatients (after your surgery is over and you are in a regular room).  ? ? ? ? ? ?Special instructions:   ? ?Oral Hygiene is also important to reduce your risk of infection.  Remember - BRUSH YOUR TEETH THE MORNING OF SURGERY WITH YOUR REGULAR TOOTHPASTE ? ? ?[facility] [facility]- Preparing For Surgery ? ?Before surgery, you can play an important role. Because skin is not  sterile, your skin needs to be as free of germs as possible. You can reduce the number of germs on your skin by washing with CHG (chlorahexidine gluconate) Soap before surgery.  CHG is an antiseptic cleaner which kills germs and bonds with the skin to continue killing germs even after washing.   ? ? ?Please do not use if you have an allergy to CHG or antibacterial soaps. If your skin becomes reddened/irritated stop using the CHG.  ?Do not shave (including legs and underarms) for at least 48 hours prior to first CHG shower. It is OK to shave  your face. ? ?Please follow these instructions carefully. ?  ? ? Shower the NIGHT BEFORE SURGERY and the MORNING OF SURGERY with CHG Soap.  ? If you chose to wash your hair, wash your hair first as usual with your normal shampoo. After you shampoo, rinse your hair and body thoroughly to remove the shampoo.  Then [employer] and genitals (private parts) with your normal soap and rinse thoroughly to remove soap. ? ?After that Use CHG Soap as you would any other liquid soap. You can apply CHG directly to the skin and wash gently with a scrungie or a clean washcloth.  ? ?Apply the CHG Soap to your body ONLY FROM THE NECK DOWN.  Do not use on open wounds or open sores. Avoid contact with your eyes, ears, mouth and genitals (private parts). Wash Face and genitals (private parts)  with your normal soap.  ? ?Wash thoroughly, paying special attention to the area where your surgery will be performed. ? ?Thoroughly rinse your body with warm water from the neck down. ? ?DO NOT shower/wash with your normal soap after using and rinsing off the CHG Soap. ? ?Pat yourself dry with a CLEAN TOWEL. ? ?Wear CLEAN PAJAMAS to bed the night before surgery ? ?Place CLEAN SHEETS on your bed the night before your surgery ? ?DO NOT SLEEP WITH PETS. ? ? ?Day of Surgery: ? ?Take a shower with CHG soap. ?Wear Clean/Comfortable clothing the morning of surgery ?Do not apply any deodorants/lotions.   ?Remember to brush your teeth WITH YOUR REGULAR TOOTHPASTE. ? ? ? ?If you received a COVID test during your pre-op visit, it is requested that you wear a mask when out in public, stay away from anyone that may not be feeling well, and notify your surgeon if you develop symptoms. If you have been in contact with anyone that has tested positive in the last 10 days, please notify your surgeon. ? ?  ?Please read over the following fact sheets that you were given.  ? ?

## 2021-05-18 ENCOUNTER — Other Ambulatory Visit: Payer: Self-pay

## 2021-05-18 ENCOUNTER — Encounter (HOSPITAL_COMMUNITY): Payer: Self-pay

## 2021-05-18 ENCOUNTER — Encounter (HOSPITAL_COMMUNITY)
Admission: RE | Admit: 2021-05-18 | Discharge: 2021-05-18 | Disposition: A | Discharge status: Home / Self Care | Payer: Medicare PPO | Source: Ambulatory Visit | Attending: Orthopedic Surgery | Admitting: Orthopedic Surgery

## 2021-05-18 VITALS — BP 126/84 | HR 78 | Temp 97.9°F | Resp 17 | Ht 68.5 in | Wt 196.8 lb

## 2021-05-18 DIAGNOSIS — Z01818 Encounter for other preprocedural examination: Secondary | ICD-10-CM | POA: Insufficient documentation

## 2021-05-18 HISTORY — DX: Unspecified osteoarthritis, unspecified site: M19.90

## 2021-05-18 LAB — SURGICAL PCR SCREEN
MRSA, PCR: NEGATIVE
Staphylococcus aureus: NEGATIVE

## 2021-05-18 LAB — BASIC METABOLIC PANEL
Anion gap: 6 (ref 5–15)
BUN: 13 mg/dL (ref 8–23)
CO2: 29 mmol/L (ref 22–32)
Calcium: 9 mg/dL (ref 8.9–10.3)
Chloride: 104 mmol/L (ref 98–111)
Creatinine, Ser: 0.91 mg/dL (ref 0.61–1.24)
GFR, Estimated: >60 mL/min (ref >60)
Glucose, Bld: 120 mg/dL — ABNORMAL HIGH (ref 70–99)
Potassium: 3.6 mmol/L (ref 3.5–5.1)
Sodium: 139 mmol/L (ref 135–145)

## 2021-05-18 LAB — CBC
HCT: 45.8 % (ref 39.0–52.0)
Hemoglobin: 14.9 g/dL (ref 13.0–17.0)
MCH: 30.3 pg (ref 26.0–34.0)
MCHC: 32.5 g/dL (ref 30.0–36.0)
MCV: 93.3 fL (ref 80.0–100.0)
Platelets: 165 10*3/uL (ref 150–400)
RBC: 4.91 MIL/uL (ref 4.22–5.81)
RDW: 13 % (ref 11.5–15.5)
WBC: 7.8 10*3/uL (ref 4.0–10.5)
nRBC: 0 % (ref 0.0–0.2)

## 2021-05-18 LAB — TYPE AND SCREEN
ABO/RH(D): O POS
Antibody Screen: NEGATIVE

## 2021-05-18 LAB — ABO/RH: ABO/RH(D): O POS

## 2021-05-18 NOTE — Pre-Procedure Instructions (Signed)
Surgical Instructions ? ? ? Your procedure is scheduled on Wednesday, May 24th. ? Report to [facility] Main Entrance "A" at 5:30 A.M., then check in with the Admitting office. ? Call this number if you have problems the morning of surgery: ? [phone] ? ? If you have any questions prior to your surgery date call [phone]: Open Monday-Friday 8am-4pm ? ? ? Remember: ? Do not eat after midnight the night before your surgery ? ?You may drink clear liquids until 4:30 a.m. the morning of your surgery.   ?Clear liquids allowed are: Water, Non-Citrus Juices (without pulp), Carbonated Beverages, Clear Tea, Black Coffee ONLY (NO MILK, CREAM OR POWDERED CREAMER of any kind), and Gatorade. ? ?Please complete your PRE-SURGERY ENSURE that was provided to you by 4:30 AM the morning of surgery.  Please, if able, drink it in one sitting. DO NOT SIP. ? ?  ? Take these medicines the morning of surgery with A SIP OF WATER:  ?gabapentin (NEURONTIN)  ? ?As needed: ?HYDROcodone-acetaminophen (NORCO/VICODIN) ? ?Follow your surgeon's instructions on when to stop Aspirin.  If no instructions were given by your surgeon then you will need to call the office to get those instructions.   ? ?As of today, STOP taking any Aleve, Naproxen, Ibuprofen, Motrin, Advil, Goody's, BC's, all herbal medications, fish oil, and all vitamins. This includes: meloxicam (MOBIC). ? ?         ? DAY OF SURGERY: ?Do not wear jewelry ?Do not wear lotions, powders, colognes, or deodorant. ?Men may shave face and neck. ?Do not bring valuables to the hospital. ? ?[facility] is not responsible for any belongings or valuables. .  ? ?Do NOT Smoke (Tobacco/Vaping)  24 hours prior to your procedure ? ?If you use a CPAP at night, you may bring your mask for your overnight stay. ?  ?Contacts, glasses, hearing aids, dentures or partials may not be worn into surgery, please bring cases for these belongings ?  ?For patients admitted to the hospital, discharge time will be  determined by your treatment team. ?  ?Patients discharged the day of surgery will not be allowed to drive home, and someone needs to stay with them for 24 hours. ? ? ?SURGICAL WAITING ROOM VISITATION ?Patients having surgery or a procedure in a hospital may have two support people. ?Children under the age of 70 must have an adult with them who is not the patient. ?They may stay in the waiting area during the procedure and may switch out with other visitors. If the patient needs to stay at the hospital during part of their recovery, the visitor guidelines for inpatient rooms apply. ? ?Please refer to the [facility] website for the visitor guidelines for Inpatients (after your surgery is over and you are in a regular room).  ? ? ?Special instructions:   ? ?Oral Hygiene is also important to reduce your risk of infection.  Remember - BRUSH YOUR TEETH THE MORNING OF SURGERY WITH YOUR REGULAR TOOTHPASTE ? ? ?- Preparing For Surgery ? ?Before surgery, you can play an important role. Because skin is not sterile, your skin needs to be as free of germs as possible. You can reduce the number of germs on your skin by washing with CHG (chlorahexidine gluconate) Soap before surgery.  CHG is an antiseptic cleaner which kills germs and bonds with the skin to continue killing germs even after washing.   ? ? ?Please do not use if you have an allergy to CHG or  antibacterial soaps. If your skin becomes reddened/irritated stop using the CHG.  ?Do not shave (including legs and underarms) for at least 48 hours prior to first CHG shower. It is OK to shave your face. ? ?Please follow these instructions carefully. ?  ? ? Shower the NIGHT BEFORE SURGERY and the MORNING OF SURGERY with CHG Soap.  ? If you chose to wash your hair, wash your hair first as usual with your normal shampoo. After you shampoo, rinse your hair and body thoroughly to remove the shampoo.  Then [employer] and genitals (private parts) with your normal soap and  rinse thoroughly to remove soap. ? ?After that Use CHG Soap as you would any other liquid soap. You can apply CHG directly to the skin and wash gently with a scrungie or a clean washcloth.  ? ?Apply the CHG Soap to your body ONLY FROM THE NECK DOWN.  Do not use on open wounds or open sores. Avoid contact with your eyes, ears, mouth and genitals (private parts). Wash Face and genitals (private parts)  with your normal soap.  ? ?Wash thoroughly, paying special attention to the area where your surgery will be performed. ? ?Thoroughly rinse your body with warm water from the neck down. ? ?DO NOT shower/wash with your normal soap after using and rinsing off the CHG Soap. ? ?Pat yourself dry with a CLEAN TOWEL. ? ?Wear CLEAN PAJAMAS to bed the night before surgery ? ?Place CLEAN SHEETS on your bed the night before your surgery ? ?DO NOT SLEEP WITH PETS. ? ? ?Day of Surgery: ? ?Take a shower with CHG soap. ?Wear Clean/Comfortable clothing the morning of surgery ?Do not apply any deodorants/lotions.   ?Remember to brush your teeth WITH YOUR REGULAR TOOTHPASTE. ? ?Please read over the following fact sheets that you were given.  ? ?

## 2021-05-18 NOTE — Progress Notes (Signed)
PCP - Blane Ohara ?Cardiologist - Dr. Dulce Sellar, patient stated he has not had a follow up visit with Dr. Dulce Sellar in about 2 years.  Patient aware that he may have to get cardiac clearance before surgery.  ? ?Chest x-ray - n/a ?EKG - 05/18/21 ? ?Aspirin Instructions: patient stated he has already stopped taking ASA ? ?ERAS Protcol - yes, Ensure given ? ? ?Anesthesia review: yes, hx of CAD ? ?Patient denies shortness of breath, fever, cough and chest pain at PAT appointment ? ? ?All instructions explained to the patient, with a verbal understanding of the material. Patient agrees to go over the instructions while at home for a better understanding. Patient also instructed to self quarantine after being tested for COVID-19. The opportunity to ask questions was provided. ? ? ?

## 2021-05-19 NOTE — Progress Notes (Signed)
Anesthesia Chart Review:  Vague diagnosis of "CAD" in patient history I called and spoke with the patient to clarify further.  He denies any history of cardiac disease, denies history of MI, stent, catheterization, advanced cardiac testing.  He reports that many years ago his PCP, Dr. Isidore Moos, was also cardiologist.  Dr. Isidore Moos ultimately transitioned his practice to cardiology patients only and the patient switched his primary care over to Dr. Blane Ohara who continues to follow all his chronic medical conditions.  Patient states he did have 1 follow-up visit with cardiologist Dr. Dulce Sellar ~8 years ago, however he reports no follow-up was recommended.  Clearance from Dr. Sedalia Muta received 05/24/21 states pt low risk for surgery.  Copy on chart.  History of recurrent pancreatitis secondary to EtOH abuse.  Last flareup November 2020.  Per notes, patient reports abstinence since that time.  Preop labs reviewed, WNL.  EKG 05/18/2021: NSR.  Rate 70.   Zannie Cove [facility] Short Stay Center/Anesthesiology Phone [phone] 05/25/2021 8:25 AM

## 2021-05-23 DIAGNOSIS — M532X6 Spinal instabilities, lumbar region: Secondary | ICD-10-CM | POA: Diagnosis not present

## 2021-05-24 ENCOUNTER — Encounter (HOSPITAL_COMMUNITY): Payer: Self-pay | Admitting: Orthopedic Surgery

## 2021-05-24 NOTE — Anesthesia Preprocedure Evaluation (Signed)
Anesthesia Evaluation  Patient identified by MRN, date of birth, ID band Patient awake    Reviewed: Allergy & Precautions, H&P , NPO status , Patient's Chart, lab work & pertinent test results  Airway Mallampati: II  TM Distance: >3 FB Neck ROM: Full    Dental no notable dental hx. (+) Teeth Intact, Dental Advisory Given   Pulmonary neg pulmonary ROS, former smoker,    Pulmonary exam normal breath sounds clear to auscultation       Cardiovascular Exercise Tolerance: Good hypertension, Pt. on medications + CAD   Rhythm:Regular Rate:Normal     Neuro/Psych negative neurological ROS  negative psych ROS   GI/Hepatic negative GI ROS, Neg liver ROS,   Endo/Other  negative endocrine ROS  Renal/GU negative Renal ROS  negative genitourinary   Musculoskeletal  (+) Arthritis , Osteoarthritis,    Abdominal   Peds  Hematology negative hematology ROS (+)   Anesthesia Other Findings   Reproductive/Obstetrics negative OB ROS                            Anesthesia Physical Anesthesia Plan  ASA: 2  Anesthesia Plan: General   Post-op Pain Management: Tylenol PO (pre-op)*   Induction: Intravenous  PONV Risk Score and Plan: 3 and Ondansetron, Dexamethasone and Treatment may vary due to age or medical condition  Airway Management Planned: Oral ETT  Additional Equipment:   Intra-op Plan:   Post-operative Plan: Extubation in OR  Informed Consent: I have reviewed the patients History and Physical, chart, labs and discussed the procedure including the risks, benefits and alternatives for the proposed anesthesia with the patient or authorized representative who has indicated his/her understanding and acceptance.     Dental advisory given  Plan Discussed with: CRNA  Anesthesia Plan Comments:        Anesthesia Quick Evaluation

## 2021-05-25 ENCOUNTER — Observation Stay (HOSPITAL_COMMUNITY)
Admission: RE | Admit: 2021-05-25 | Discharge: 2021-05-26 | Disposition: A | Discharge status: Home / Self Care | Payer: Medicare PPO | Attending: Orthopedic Surgery | Admitting: Orthopedic Surgery

## 2021-05-25 ENCOUNTER — Ambulatory Visit (HOSPITAL_COMMUNITY): Payer: Medicare PPO

## 2021-05-25 ENCOUNTER — Encounter: Payer: Self-pay | Admitting: Specialist

## 2021-05-25 ENCOUNTER — Ambulatory Visit (HOSPITAL_BASED_OUTPATIENT_CLINIC_OR_DEPARTMENT_OTHER): Payer: Medicare PPO | Admitting: Anesthesiology

## 2021-05-25 ENCOUNTER — Encounter (HOSPITAL_COMMUNITY): Payer: Self-pay | Admitting: Orthopedic Surgery

## 2021-05-25 ENCOUNTER — Ambulatory Visit (HOSPITAL_COMMUNITY): Payer: Medicare PPO | Admitting: Physician Assistant

## 2021-05-25 ENCOUNTER — Other Ambulatory Visit: Payer: Self-pay

## 2021-05-25 ENCOUNTER — Encounter (HOSPITAL_COMMUNITY)
Admission: RE | Disposition: A | Discharge status: Home / Self Care | Payer: Self-pay | Source: Home / Self Care | Attending: Orthopedic Surgery

## 2021-05-25 DIAGNOSIS — Z981 Arthrodesis status: Secondary | ICD-10-CM | POA: Diagnosis not present

## 2021-05-25 DIAGNOSIS — M5416 Radiculopathy, lumbar region: Secondary | ICD-10-CM | POA: Diagnosis not present

## 2021-05-25 DIAGNOSIS — Z87891 Personal history of nicotine dependence: Secondary | ICD-10-CM | POA: Insufficient documentation

## 2021-05-25 DIAGNOSIS — Z79899 Other long term (current) drug therapy: Secondary | ICD-10-CM | POA: Diagnosis not present

## 2021-05-25 DIAGNOSIS — M5116 Intervertebral disc disorders with radiculopathy, lumbar region: Secondary | ICD-10-CM | POA: Diagnosis not present

## 2021-05-25 DIAGNOSIS — Z7982 Long term (current) use of aspirin: Secondary | ICD-10-CM | POA: Diagnosis not present

## 2021-05-25 DIAGNOSIS — I2510 Atherosclerotic heart disease of native coronary artery without angina pectoris: Secondary | ICD-10-CM | POA: Diagnosis not present

## 2021-05-25 DIAGNOSIS — M532X6 Spinal instabilities, lumbar region: Secondary | ICD-10-CM | POA: Diagnosis not present

## 2021-05-25 DIAGNOSIS — M2578 Osteophyte, vertebrae: Secondary | ICD-10-CM | POA: Insufficient documentation

## 2021-05-25 DIAGNOSIS — M4316 Spondylolisthesis, lumbar region: Secondary | ICD-10-CM | POA: Insufficient documentation

## 2021-05-25 DIAGNOSIS — M4326 Fusion of spine, lumbar region: Secondary | ICD-10-CM | POA: Diagnosis not present

## 2021-05-25 DIAGNOSIS — M48061 Spinal stenosis, lumbar region without neurogenic claudication: Principal | ICD-10-CM | POA: Insufficient documentation

## 2021-05-25 DIAGNOSIS — M5410 Radiculopathy, site unspecified: Principal | ICD-10-CM | POA: Diagnosis present

## 2021-05-25 DIAGNOSIS — M48062 Spinal stenosis, lumbar region with neurogenic claudication: Secondary | ICD-10-CM | POA: Diagnosis not present

## 2021-05-25 HISTORY — PX: TRANSFORAMINAL LUMBAR INTERBODY FUSION (TLIF) WITH PEDICLE SCREW FIXATION 2 LEVEL: SHX6142

## 2021-05-25 SURGERY — TRANSFORAMINAL LUMBAR INTERBODY FUSION (TLIF) WITH PEDICLE SCREW FIXATION 2 LEVEL
Anesthesia: General | Site: Spine Lumbar | Laterality: Right

## 2021-05-25 MED ORDER — CHLORHEXIDINE GLUCONATE 0.12 % MT SOLN
OROMUCOSAL | Status: AC
  Filled 2021-05-25: qty 15

## 2021-05-25 MED ORDER — ACETAMINOPHEN 325 MG PO TABS: 650.0000 mg | ORAL_TABLET | ORAL | Status: DC | PRN

## 2021-05-25 MED ORDER — FLEET ENEMA 7-19 GM/118ML RE ENEM: 1.0000 | ENEMA | Freq: Once | RECTAL | Status: DC | PRN

## 2021-05-25 MED ORDER — EPHEDRINE 5 MG/ML INJ
INTRAVENOUS | Status: AC
  Filled 2021-05-25: qty 5

## 2021-05-25 MED ORDER — ALBUMIN HUMAN 5 % IV SOLN
INTRAVENOUS | Status: AC
  Filled 2021-05-25: qty 250

## 2021-05-25 MED ORDER — PHENYLEPHRINE HCL-NACL 20-0.9 MG/250ML-% IV SOLN
INTRAVENOUS | Status: AC
  Filled 2021-05-25: qty 500

## 2021-05-25 MED ORDER — ALBUMIN HUMAN 5 % IV SOLN
12.5000 g | Freq: Once | INTRAVENOUS | Status: AC
  Administered 2021-05-25: 12.5 g via INTRAVENOUS

## 2021-05-25 MED ORDER — DEXAMETHASONE SODIUM PHOSPHATE 10 MG/ML IJ SOLN
INTRAMUSCULAR | Status: DC | PRN
  Administered 2021-05-25: 10 mg via INTRAVENOUS

## 2021-05-25 MED ORDER — SODIUM CHLORIDE 0.9% FLUSH: 3.0000 mL | INTRAVENOUS | Status: DC | PRN

## 2021-05-25 MED ORDER — ZOLPIDEM TARTRATE 5 MG PO TABS: 5.0000 mg | ORAL_TABLET | Freq: Every evening | ORAL | Status: DC | PRN

## 2021-05-25 MED ORDER — CEFAZOLIN SODIUM-DEXTROSE 2-4 GM/100ML-% IV SOLN
2.0000 g | Freq: Three times a day (TID) | INTRAVENOUS | Status: AC
  Administered 2021-05-25 – 2021-05-26 (×2): 2 g via INTRAVENOUS
  Filled 2021-05-25 (×2): qty 100

## 2021-05-25 MED ORDER — LACTATED RINGERS IV SOLN: INTRAVENOUS | Status: DC | PRN

## 2021-05-25 MED ORDER — THROMBIN 20000 UNITS EX SOLR
CUTANEOUS | Status: DC | PRN
  Administered 2021-05-25: 20000 [IU] via TOPICAL

## 2021-05-25 MED ORDER — LIDOCAINE 2% (20 MG/ML) 5 ML SYRINGE
INTRAMUSCULAR | Status: DC | PRN
  Administered 2021-05-25: 60 mg via INTRAVENOUS

## 2021-05-25 MED ORDER — PROPOFOL 10 MG/ML IV BOLUS
INTRAVENOUS | Status: AC
  Filled 2021-05-25: qty 20

## 2021-05-25 MED ORDER — BUPIVACAINE-EPINEPHRINE 0.25% -1:200000 IJ SOLN
INTRAMUSCULAR | Status: DC | PRN
  Administered 2021-05-25: 23 mL
  Administered 2021-05-25: 7 mL

## 2021-05-25 MED ORDER — METHOCARBAMOL 1000 MG/10ML IJ SOLN
500.0000 mg | Freq: Four times a day (QID) | INTRAVENOUS | Status: DC | PRN
  Filled 2021-05-25: qty 5

## 2021-05-25 MED ORDER — FENTANYL CITRATE (PF) 250 MCG/5ML IJ SOLN
INTRAMUSCULAR | Status: DC | PRN
  Administered 2021-05-25: 50 ug via INTRAVENOUS
  Administered 2021-05-25: 100 ug via INTRAVENOUS
  Administered 2021-05-25: 50 ug via INTRAVENOUS

## 2021-05-25 MED ORDER — ADULT MULTIVITAMIN W/MINERALS CH
1.0000 | ORAL_TABLET | Freq: Every day | ORAL | Status: DC
  Administered 2021-05-25 – 2021-05-26 (×2): 1 via ORAL
  Filled 2021-05-25 (×2): qty 1

## 2021-05-25 MED ORDER — OXYCODONE-ACETAMINOPHEN 5-325 MG PO TABS
1.0000 | ORAL_TABLET | ORAL | Status: DC | PRN
  Administered 2021-05-25 – 2021-05-26 (×5): 2 via ORAL
  Filled 2021-05-25 (×5): qty 2

## 2021-05-25 MED ORDER — SODIUM CHLORIDE 0.9% FLUSH
3.0000 mL | Freq: Two times a day (BID) | INTRAVENOUS | Status: DC
  Administered 2021-05-25: 3 mL via INTRAVENOUS

## 2021-05-25 MED ORDER — ONDANSETRON HCL 4 MG/2ML IJ SOLN: 4.0000 mg | Freq: Four times a day (QID) | INTRAMUSCULAR | Status: DC | PRN

## 2021-05-25 MED ORDER — ALUM & MAG HYDROXIDE-SIMETH 200-200-20 MG/5ML PO SUSP: 30.0000 mL | Freq: Four times a day (QID) | ORAL | Status: DC | PRN

## 2021-05-25 MED ORDER — MIDAZOLAM HCL 2 MG/2ML IJ SOLN
INTRAMUSCULAR | Status: DC | PRN
  Administered 2021-05-25: 1 mg via INTRAVENOUS

## 2021-05-25 MED ORDER — ROCURONIUM BROMIDE 10 MG/ML (PF) SYRINGE
PREFILLED_SYRINGE | INTRAVENOUS | Status: AC
  Filled 2021-05-25: qty 10

## 2021-05-25 MED ORDER — BUPIVACAINE LIPOSOME 1.3 % IJ SUSP
INTRAMUSCULAR | Status: AC
  Filled 2021-05-25: qty 20

## 2021-05-25 MED ORDER — METHYLENE BLUE 1 % INJ SOLN
INTRAVENOUS | Status: AC
  Filled 2021-05-25: qty 10

## 2021-05-25 MED ORDER — VALSARTAN-HYDROCHLOROTHIAZIDE 160-25 MG PO TABS: 1.0000 | ORAL_TABLET | Freq: Every day | ORAL | Status: DC

## 2021-05-25 MED ORDER — PHENYLEPHRINE 80 MCG/ML (10ML) SYRINGE FOR IV PUSH (FOR BLOOD PRESSURE SUPPORT)
PREFILLED_SYRINGE | INTRAVENOUS | Status: AC
  Filled 2021-05-25: qty 10

## 2021-05-25 MED ORDER — SENNOSIDES-DOCUSATE SODIUM 8.6-50 MG PO TABS
1.0000 | ORAL_TABLET | Freq: Every evening | ORAL | Status: DC | PRN
  Administered 2021-05-25: 1 via ORAL
  Filled 2021-05-25: qty 1

## 2021-05-25 MED ORDER — ONDANSETRON HCL 4 MG PO TABS: 4.0000 mg | ORAL_TABLET | Freq: Four times a day (QID) | ORAL | Status: DC | PRN

## 2021-05-25 MED ORDER — MIDAZOLAM HCL 2 MG/2ML IJ SOLN
INTRAMUSCULAR | Status: AC
  Filled 2021-05-25: qty 2

## 2021-05-25 MED ORDER — 0.9 % SODIUM CHLORIDE (POUR BTL) OPTIME
TOPICAL | Status: DC | PRN
  Administered 2021-05-25 (×4): 1000 mL

## 2021-05-25 MED ORDER — ACETAMINOPHEN 10 MG/ML IV SOLN
INTRAVENOUS | Status: AC
  Administered 2021-05-25: 1000 mg via INTRAVENOUS
  Filled 2021-05-25: qty 100

## 2021-05-25 MED ORDER — KETAMINE HCL 50 MG/5ML IJ SOSY
PREFILLED_SYRINGE | INTRAMUSCULAR | Status: AC
  Filled 2021-05-25: qty 5

## 2021-05-25 MED ORDER — ACETAMINOPHEN 650 MG RE SUPP: 650.0000 mg | RECTAL | Status: DC | PRN

## 2021-05-25 MED ORDER — DOCUSATE SODIUM 100 MG PO CAPS
100.0000 mg | ORAL_CAPSULE | Freq: Two times a day (BID) | ORAL | Status: DC
  Administered 2021-05-25 – 2021-05-26 (×3): 100 mg via ORAL
  Filled 2021-05-25 (×3): qty 1

## 2021-05-25 MED ORDER — POVIDONE-IODINE 7.5 % EX SOLN: Freq: Once | CUTANEOUS | Status: DC

## 2021-05-25 MED ORDER — SIMVASTATIN 5 MG PO TABS
10.0000 mg | ORAL_TABLET | Freq: Every day | ORAL | Status: DC
  Administered 2021-05-25: 10 mg via ORAL
  Filled 2021-05-25: qty 2

## 2021-05-25 MED ORDER — CEFAZOLIN SODIUM-DEXTROSE 2-4 GM/100ML-% IV SOLN
INTRAVENOUS | Status: AC
  Filled 2021-05-25: qty 100

## 2021-05-25 MED ORDER — PHENYLEPHRINE 80 MCG/ML (10ML) SYRINGE FOR IV PUSH (FOR BLOOD PRESSURE SUPPORT)
PREFILLED_SYRINGE | INTRAVENOUS | Status: DC | PRN
  Administered 2021-05-25: 80 ug via INTRAVENOUS
  Administered 2021-05-25: 40 ug via INTRAVENOUS

## 2021-05-25 MED ORDER — ACETAMINOPHEN 10 MG/ML IV SOLN: 1000.0000 mg | Freq: Once | INTRAVENOUS | Status: AC

## 2021-05-25 MED ORDER — CHLORHEXIDINE GLUCONATE 0.12 % MT SOLN
15.0000 mL | Freq: Once | OROMUCOSAL | Status: AC
  Administered 2021-05-25: 15 mL via OROMUCOSAL

## 2021-05-25 MED ORDER — PROPOFOL 10 MG/ML IV BOLUS
INTRAVENOUS | Status: DC | PRN
  Administered 2021-05-25: 20 mg via INTRAVENOUS
  Administered 2021-05-25: 110 mg via INTRAVENOUS
  Administered 2021-05-25: 30 mg via INTRAVENOUS

## 2021-05-25 MED ORDER — PHENYLEPHRINE HCL-NACL 20-0.9 MG/250ML-% IV SOLN
INTRAVENOUS | Status: DC | PRN
  Administered 2021-05-25: 30 ug/min via INTRAVENOUS

## 2021-05-25 MED ORDER — HEMOSTATIC AGENTS (NO CHARGE) OPTIME
TOPICAL | Status: DC | PRN
Start: 2021-05-25 — End: 2021-05-25
  Administered 2021-05-25: 1 via TOPICAL

## 2021-05-25 MED ORDER — DEXAMETHASONE SODIUM PHOSPHATE 10 MG/ML IJ SOLN
INTRAMUSCULAR | Status: AC
  Filled 2021-05-25: qty 1

## 2021-05-25 MED ORDER — SUGAMMADEX SODIUM 500 MG/5ML IV SOLN
INTRAVENOUS | Status: AC
  Filled 2021-05-25: qty 5

## 2021-05-25 MED ORDER — KETAMINE HCL 10 MG/ML IJ SOLN
INTRAMUSCULAR | Status: DC | PRN
  Administered 2021-05-25: 40 mg via INTRAVENOUS

## 2021-05-25 MED ORDER — MENTHOL 3 MG MT LOZG: 1.0000 | LOZENGE | OROMUCOSAL | Status: DC | PRN

## 2021-05-25 MED ORDER — METHOCARBAMOL 500 MG PO TABS
500.0000 mg | ORAL_TABLET | Freq: Four times a day (QID) | ORAL | Status: DC | PRN
  Administered 2021-05-25 – 2021-05-26 (×2): 500 mg via ORAL
  Filled 2021-05-25 (×3): qty 1

## 2021-05-25 MED ORDER — PHENOL 1.4 % MT LIQD: 1.0000 | OROMUCOSAL | Status: DC | PRN

## 2021-05-25 MED ORDER — EPHEDRINE SULFATE-NACL 50-0.9 MG/10ML-% IV SOSY
PREFILLED_SYRINGE | INTRAVENOUS | Status: DC | PRN
  Administered 2021-05-25 (×4): 5 mg via INTRAVENOUS

## 2021-05-25 MED ORDER — CEFAZOLIN SODIUM-DEXTROSE 2-4 GM/100ML-% IV SOLN
2.0000 g | INTRAVENOUS | Status: AC
  Administered 2021-05-25 (×2): 2 g via INTRAVENOUS

## 2021-05-25 MED ORDER — ACETAMINOPHEN 500 MG PO TABS
ORAL_TABLET | ORAL | Status: AC
  Filled 2021-05-25: qty 2

## 2021-05-25 MED ORDER — SUGAMMADEX SODIUM 200 MG/2ML IV SOLN
INTRAVENOUS | Status: DC | PRN
  Administered 2021-05-25: 200 mg via INTRAVENOUS

## 2021-05-25 MED ORDER — BISACODYL 5 MG PO TBEC: 5.0000 mg | DELAYED_RELEASE_TABLET | Freq: Every day | ORAL | Status: DC | PRN

## 2021-05-25 MED ORDER — HYDROCHLOROTHIAZIDE 25 MG PO TABS
25.0000 mg | ORAL_TABLET | Freq: Every day | ORAL | Status: DC
  Administered 2021-05-25 – 2021-05-26 (×2): 25 mg via ORAL
  Filled 2021-05-25 (×2): qty 1

## 2021-05-25 MED ORDER — ORAL CARE MOUTH RINSE: 15.0000 mL | Freq: Once | OROMUCOSAL | Status: AC

## 2021-05-25 MED ORDER — MORPHINE SULFATE (PF) 2 MG/ML IV SOLN: 1.0000 mg | INTRAVENOUS | Status: DC | PRN

## 2021-05-25 MED ORDER — ACETAMINOPHEN 500 MG PO TABS
1000.0000 mg | ORAL_TABLET | Freq: Once | ORAL | Status: AC
  Administered 2021-05-25: 500 mg via ORAL

## 2021-05-25 MED ORDER — BUPIVACAINE HCL (PF) 0.25 % IJ SOLN
INTRAMUSCULAR | Status: AC
  Filled 2021-05-25: qty 30

## 2021-05-25 MED ORDER — ONDANSETRON HCL 4 MG/2ML IJ SOLN
INTRAMUSCULAR | Status: DC | PRN
  Administered 2021-05-25: 4 mg via INTRAVENOUS

## 2021-05-25 MED ORDER — POTASSIUM CHLORIDE IN NACL 20-0.9 MEQ/L-% IV SOLN: INTRAVENOUS | Status: DC

## 2021-05-25 MED ORDER — IRBESARTAN 150 MG PO TABS
150.0000 mg | ORAL_TABLET | Freq: Every day | ORAL | Status: DC
  Administered 2021-05-25 – 2021-05-26 (×2): 150 mg via ORAL
  Filled 2021-05-25 (×2): qty 1

## 2021-05-25 MED ORDER — FENTANYL CITRATE (PF) 250 MCG/5ML IJ SOLN
INTRAMUSCULAR | Status: AC
  Filled 2021-05-25: qty 5

## 2021-05-25 MED ORDER — LIDOCAINE 2% (20 MG/ML) 5 ML SYRINGE
INTRAMUSCULAR | Status: AC
  Filled 2021-05-25: qty 5

## 2021-05-25 MED ORDER — BUPIVACAINE LIPOSOME 1.3 % IJ SUSP
INTRAMUSCULAR | Status: DC | PRN
Start: 2021-05-25 — End: 2021-05-25
  Administered 2021-05-25: 20 mL

## 2021-05-25 MED ORDER — PHENYLEPHRINE HCL-NACL 20-0.9 MG/250ML-% IV SOLN
INTRAVENOUS | Status: DC | PRN
Start: 2021-05-25 — End: 2021-05-25

## 2021-05-25 MED ORDER — SODIUM CHLORIDE 0.9 % IV SOLN
250.0000 mL | INTRAVENOUS | Status: DC
  Administered 2021-05-25: 250 mL via INTRAVENOUS

## 2021-05-25 MED ORDER — GABAPENTIN 300 MG PO CAPS
600.0000 mg | ORAL_CAPSULE | Freq: Three times a day (TID) | ORAL | Status: DC
  Administered 2021-05-25 – 2021-05-26 (×3): 600 mg via ORAL
  Filled 2021-05-25 (×3): qty 2

## 2021-05-25 MED ORDER — ASCORBIC ACID 500 MG PO TABS
1000.0000 mg | ORAL_TABLET | Freq: Every day | ORAL | Status: DC
  Administered 2021-05-25 – 2021-05-26 (×2): 1000 mg via ORAL
  Filled 2021-05-25 (×2): qty 2

## 2021-05-25 MED ORDER — HYDROCODONE-ACETAMINOPHEN 5-325 MG PO TABS: 1.0000 | ORAL_TABLET | ORAL | Status: DC | PRN

## 2021-05-25 MED ORDER — ONDANSETRON HCL 4 MG/2ML IJ SOLN
INTRAMUSCULAR | Status: AC
  Filled 2021-05-25: qty 2

## 2021-05-25 MED ORDER — THROMBIN 20000 UNITS EX SOLR
CUTANEOUS | Status: AC
  Filled 2021-05-25: qty 20000

## 2021-05-25 MED ORDER — LACTATED RINGERS IV SOLN
INTRAVENOUS | Status: DC
Start: 2021-05-25 — End: 2021-05-25

## 2021-05-25 MED ORDER — ROCURONIUM BROMIDE 10 MG/ML (PF) SYRINGE
PREFILLED_SYRINGE | INTRAVENOUS | Status: DC | PRN
  Administered 2021-05-25: 60 mg via INTRAVENOUS
  Administered 2021-05-25 (×2): 20 mg via INTRAVENOUS

## 2021-05-25 MED ORDER — HYDROMORPHONE HCL 1 MG/ML IJ SOLN: 0.2500 mg | INTRAMUSCULAR | Status: DC | PRN

## 2021-05-25 SURGICAL SUPPLY — 96 items
BAG COUNTER SPONGE SURGICOUNT (BAG) ×2 IMPLANT
BENZOIN TINCTURE PRP APPL 2/3 (GAUZE/BANDAGES/DRESSINGS) ×2 IMPLANT
BLADE CLIPPER SURG (BLADE) ×1 IMPLANT
BUR PRESCISION 1.7 ELITE (BURR) ×2 IMPLANT
BUR ROUND FLUTED 5 RND (BURR) ×1 IMPLANT
BUR ROUND PRECISION 4.0 (BURR) ×1 IMPLANT
BUR SABER RD CUTTING 3.0 (BURR) IMPLANT
CAGE SABLE 10X30 9-16 8D (Cage) ×2 IMPLANT
CANNULA GRAFT BNE VG PRE-FILL (Bone Implant) IMPLANT
CARTRIDGE OIL MAESTRO DRILL (MISCELLANEOUS) ×1 IMPLANT
CNTNR URN SCR LID CUP LEK RST (MISCELLANEOUS) ×1 IMPLANT
CONT SPEC 4OZ STRL OR WHT (MISCELLANEOUS) ×1
COVER LIGHT HANDLE UNIVERSAL (MISCELLANEOUS) ×2 IMPLANT
COVER MAYO STAND STRL (DRAPES) ×4 IMPLANT
COVER SURGICAL LIGHT HANDLE (MISCELLANEOUS) ×2 IMPLANT
DIFFUSER DRILL AIR PNEUMATIC (MISCELLANEOUS) ×2 IMPLANT
DISPENSER GRAFT BNE VG (MISCELLANEOUS) IMPLANT
DISPENSER VIVIGEN BONE GRAFT (MISCELLANEOUS) ×2 IMPLANT
DRAIN CHANNEL 15F RND FF W/TCR (WOUND CARE) IMPLANT
DRAPE C-ARM 42X72 X-RAY (DRAPES) ×2 IMPLANT
DRAPE C-ARMOR (DRAPES) IMPLANT
DRAPE POUCH INSTRU U-SHP 10X18 (DRAPES) ×2 IMPLANT
DRAPE SURG 17X23 STRL (DRAPES) ×8 IMPLANT
DURAPREP 26ML APPLICATOR (WOUND CARE) ×2 IMPLANT
ELECT BLADE 4.0 EZ CLEAN MEGAD (MISCELLANEOUS) ×2
ELECT CAUTERY BLADE 6.4 (BLADE) ×2 IMPLANT
ELECT REM PT RETURN 9FT ADLT (ELECTROSURGICAL) ×2
ELECTRODE BLDE 4.0 EZ CLN MEGD (MISCELLANEOUS) ×1 IMPLANT
ELECTRODE REM PT RTRN 9FT ADLT (ELECTROSURGICAL) ×1 IMPLANT
EVACUATOR SILICONE 100CC (DRAIN) IMPLANT
FILTER STRAW FLUID ASPIR (MISCELLANEOUS) ×2 IMPLANT
GAUZE 4X4 16PLY ~~LOC~~+RFID DBL (SPONGE) ×2 IMPLANT
GAUZE SPONGE 4X4 12PLY STRL (GAUZE/BANDAGES/DRESSINGS) ×2 IMPLANT
GAUZE SPONGE 4X4 12PLY STRL LF (GAUZE/BANDAGES/DRESSINGS) ×1 IMPLANT
GLOVE BIO SURGEON STRL SZ7 (GLOVE) ×2 IMPLANT
GLOVE BIO SURGEON STRL SZ8 (GLOVE) ×2 IMPLANT
GLOVE BIOGEL PI IND STRL 7.0 (GLOVE) ×1 IMPLANT
GLOVE BIOGEL PI IND STRL 7.5 (GLOVE) IMPLANT
GLOVE BIOGEL PI IND STRL 8 (GLOVE) ×1 IMPLANT
GLOVE BIOGEL PI INDICATOR 7.0 (GLOVE) ×1
GLOVE BIOGEL PI INDICATOR 7.5 (GLOVE) ×1
GLOVE BIOGEL PI INDICATOR 8 (GLOVE) ×1
GLOVE ECLIPSE 7.5 STRL STRAW (GLOVE) ×2 IMPLANT
GLOVE SURG ENC MOIS LTX SZ6.5 (GLOVE) ×2 IMPLANT
GOWN STRL REUS W/ TWL LRG LVL3 (GOWN DISPOSABLE) ×2 IMPLANT
GOWN STRL REUS W/ TWL XL LVL3 (GOWN DISPOSABLE) ×1 IMPLANT
GOWN STRL REUS W/TWL LRG LVL3 (GOWN DISPOSABLE) ×3
GOWN STRL REUS W/TWL XL LVL3 (GOWN DISPOSABLE) ×3
GRAFT BONE CANNULA VIVIGEN 3 (Bone Implant) ×12 IMPLANT
IV CATH 14GX2 1/4 (CATHETERS) ×2 IMPLANT
KIT BASIN OR (CUSTOM PROCEDURE TRAY) ×2 IMPLANT
KIT POSITION SURG JACKSON T1 (MISCELLANEOUS) ×2 IMPLANT
KIT TURNOVER KIT B (KITS) ×2 IMPLANT
MARKER SKIN DUAL TIP RULER LAB (MISCELLANEOUS) ×5 IMPLANT
NDL 18GX1X1/2 (RX/OR ONLY) (NEEDLE) ×1 IMPLANT
NDL HYPO 25GX1X1/2 BEV (NEEDLE) ×1 IMPLANT
NDL SPNL 18GX3.5 QUINCKE PK (NEEDLE) ×2 IMPLANT
NEEDLE 18GX1X1/2 (RX/OR ONLY) (NEEDLE) ×2 IMPLANT
NEEDLE 22X1 1/2 (OR ONLY) (NEEDLE) ×3 IMPLANT
NEEDLE HYPO 25GX1X1/2 BEV (NEEDLE) ×2 IMPLANT
NEEDLE SPNL 18GX3.5 QUINCKE PK (NEEDLE) ×4 IMPLANT
NS IRRIG 1000ML POUR BTL (IV SOLUTION) ×2 IMPLANT
OIL CARTRIDGE MAESTRO DRILL (MISCELLANEOUS) ×2
PACK LAMINECTOMY ORTHO (CUSTOM PROCEDURE TRAY) ×2 IMPLANT
PACK UNIVERSAL I (CUSTOM PROCEDURE TRAY) ×2 IMPLANT
PAD ARMBOARD 7.5X6 YLW CONV (MISCELLANEOUS) ×4 IMPLANT
PATTIES SURGICAL .5 X1 (DISPOSABLE) ×1 IMPLANT
PATTIES SURGICAL .5X1.5 (GAUZE/BANDAGES/DRESSINGS) ×2 IMPLANT
ROD EXPEDIUM PRE BENT 5.5X75 (Rod) ×2 IMPLANT
SCREW CORTICAL VIPER 7X35 (Screw) ×5 IMPLANT
SCREW CORTICAL VIPER 7X40MM (Screw) ×1 IMPLANT
SCREW SET SINGLE INNER (Screw) ×6 IMPLANT
SPONGE INTESTINAL PEANUT (DISPOSABLE) ×2 IMPLANT
SPONGE SURGIFOAM ABS GEL 100 (HEMOSTASIS) ×2 IMPLANT
STRIP CLOSURE SKIN 1/2X4 (GAUZE/BANDAGES/DRESSINGS) ×4 IMPLANT
SURGIFLO W/THROMBIN 8M KIT (HEMOSTASIS) IMPLANT
SUT ETHILON 2 0 FS 18 (SUTURE) ×1 IMPLANT
SUT MNCRL AB 4-0 PS2 18 (SUTURE) ×2 IMPLANT
SUT VIC AB 0 CT1 18XCR BRD 8 (SUTURE) ×1 IMPLANT
SUT VIC AB 0 CT1 8-18 (SUTURE) ×1
SUT VIC AB 1 CT1 18XCR BRD 8 (SUTURE) ×1 IMPLANT
SUT VIC AB 1 CT1 8-18 (SUTURE) ×1
SUT VIC AB 2-0 CT2 18 VCP726D (SUTURE) ×2 IMPLANT
SYR 20ML LL LF (SYRINGE) ×4 IMPLANT
SYR BULB IRRIG 60ML STRL (SYRINGE) ×2 IMPLANT
SYR CONTROL 10ML LL (SYRINGE) ×4 IMPLANT
SYR TB 1ML LUER SLIP (SYRINGE) ×2 IMPLANT
TAP EXPEDIUM DL 4.35 (INSTRUMENTS) ×1 IMPLANT
TAP EXPEDIUM DL 5.0 (INSTRUMENTS) ×1 IMPLANT
TAP EXPEDIUM DL 6.0 (INSTRUMENTS) ×1 IMPLANT
TAP EXPEDIUM DL 7.0 (INSTRUMENTS) ×1
TAP EXPEDIUM DL 7X2 (INSTRUMENTS) IMPLANT
TAPE CLOTH 4X10 WHT NS (GAUZE/BANDAGES/DRESSINGS) ×1 IMPLANT
TRAY FOLEY MTR SLVR 16FR STAT (SET/KITS/TRAYS/PACK) ×2 IMPLANT
WATER STERILE IRR 1000ML POUR (IV SOLUTION) ×2 IMPLANT
YANKAUER SUCT BULB TIP NO VENT (SUCTIONS) ×2 IMPLANT

## 2021-05-25 NOTE — Op Note (Signed)
PATIENT NAME: Austin Atkins   MEDICAL RECORD NO.:   [id number]    DATE OF BIRTH: [birth year]    DATE OF PROCEDURE: 05/25/2021                                 OPERATIVE REPORT     PREOPERATIVE DIAGNOSES: 1. Severe spinal stenosis L3-4, L4-5 (M48.062) 2. Bilateral lumbar radiculopathy (M54.16) 3. L4-5 spondylolisthesis (M53.2X6) 4. Disc osteophyte complex and disc herniation affecting the right L3-4 lateral recess and extraforaminal region   POSTOPERATIVE DIAGNOSES: 1. Severe spinal stenosis L3-4, L4-5 (M48.062) 2. Bilateral lumbar radiculopathy (M54.16) 3. L4-5 spondylolisthesis (M53.2X6) 4. Disc osteophyte complex and disc herniation affecting the right L3-4 lateral recess and extraforaminal region   PROCEDURES: 1. Lumbar decompression, L3-4, L4-5, including bilateral partial facetectomy, and bilateral lumbar decompression 2. Right-sided L3-4, L4-5 transforaminal lumbar interbody fusion. 3. Left-sided L3-4, L4-5 posterolateral fusion. 4. Insertion of interbody device x 2 (Globus expandable intervertebral spacers). 5. Placement of segmental posterior instrumentation L3, L4, L5, bilaterally. 6. Use of local autograft. 7. Use of morselized allograft - ViviGen. 8. Intraoperative use of fluoroscopy.   SURGEON:  Estill Bamberg, MD.   ASSISTANTJason Coop, PA-C.   ANESTHESIA:  General endotracheal anesthesia.   COMPLICATIONS:  None.   DISPOSITION:  Stable.   ESTIMATED BLOOD LOSS:  250cc   INDICATIONS FOR SURGERY:  Briefly, Mr. Weaver is a pleasant 76 y.o. -year-old male, who did present to me with severe and ongoing pain in the bilateral legs. I did feel that the symptoms were secondary to the findings noted above.  The patient failed conservative care and did wish to proceed with the procedure noted above.    OPERATIVE DETAILS:  On 05/25/2021, the patient was brought to surgery and general endotracheal anesthesia was administered.  The patient was placed prone on a  well-padded flat Jackson bed with a spinal frame.  Antibiotics were given and a time-out procedure was performed. The back was prepped and draped in the usual fashion.  A midline incision was made overlying the L3-4 and L4-5 intervertebral spaces.  The fascia was incised at the midline.  The paraspinal musculature was bluntly swept laterally.  Anatomic landmarks for the pedicles were exposed. Using fluoroscopy, I did cannulate the L3, L4, and L5 pedicles bilaterally, using a medial to lateral cortical trajectory technique.  On the left side, the posterolateral gutter and facet joints at L3-4 and L4-5 were decorticated and 7 mm screws of the appropriate length were placed at L3, L4, and L5 pedicles and a 65-mm rod was placed and distraction was applied across the rod at each intervertebral level.  On the right side, the cannulated pedicle holes were filled with bone wax.  I then proceeded with the decompressive aspect of the procedure.     Starting at L4-5, I did perform a laminotomy and a full facetectomy on the right.  I was able to thoroughly and entirely decompress the L4-5 intervertebral space bilaterally, removing facet hypertrophy and ligamentum flavum hypertrophy.  At this point, with an assistant holding medial retraction of the traversing right L5 nerve, I did perform a thorough and complete L4-5 intervertebral discectomy.  The intervertebral space was then liberally packed with autograft from the decompression, as well as allograft in the form of ViviGen, as was the appropriately sized intervertebral spacer.  The spacer was then expanded to approximately 12.6 mm in height.  Distraction was then  released on the contralateral left side.  I then turned my attention to the L3-4 level.  Once again, it was clearly evident that there was stenosis at the L3-4 level.  The stenosis was thoroughly and adequately decompressed by performing a bilateral partial facetectomy.  Then, on the right, a full  facetectomy was performed, and a prominent, compressive, disc osteophyte complex was removed ventral to the right L3 and L4 nerves.  In addition, there was a rather large disc herniation noted at L3-4, extending into the right lateral recess.  This was removed in its entirety.  I was able to thoroughly decompress both the right L3 and right L4 nerves. With an assistant holding medial retraction of the traversing right L4 nerve, I did perform an annulotomy at the posterolateral aspect of the L3-4 intervertebral space.  I then used a series of curettes and pituitary rongeurs to perform a thorough and complete intervertebral diskectomy.  The intervertebral space was then liberally packed with autograft as well as allograft in the form of ViviGen, as was the appropriate-sized intervertebral spacer.  The spacer was then tamped into position in the usual fashion, and expanded to approximately 12.9 mm in height.  I was very pleased with the press-fit of the spacer.  I then placed 7 mm screws on the right at L3, L4, and L5.  A 65-mm rod was then placed and caps were placed. The distraction was then released on the contralateral left side.  All 6 caps were then locked.  The wound was copiously irrigated with a total of approximately 3 L prior to placing the bone graft.  Additional autograft and allograft were then packed into the posterolateral gutter on the right side to help aid in the success of the fusion.  The wound was  explored for any undue bleeding and there was no substantial bleeding encountered.  Gel-Foam was placed over the laminectomy site.  The wound was then closed in layers using #1 Vicryl followed by 2-0 Vicryl, followed by 4-0 Monocryl.  Benzoin and Steri-Strips were applied followed by sterile dressing.       Of note, Jason Coop was my assistant throughout surgery, and did aid in retraction, placement of the hardware, suctioning, and closure.       Estill Bamberg, MD

## 2021-05-25 NOTE — Anesthesia Postprocedure Evaluation (Signed)
Anesthesia Post Note  Patient: Austin Atkins  Procedure(s) Performed: RIGHT-SIDED LUMBAR 3- LUMBAR 4, LUMBAR 4- LUMBAR 5 TRANSFORAMINAL LUMBAR INTERBODY FUSION AND DECOMPRESSION WITH INSTRUMENTATION AND ALLOGRAFT (Right: Spine Lumbar)     Patient location during evaluation: PACU Anesthesia Type: General Level of consciousness: awake and alert Pain management: pain level controlled Vital Signs Assessment: post-procedure vital signs reviewed and stable Respiratory status: spontaneous breathing, nonlabored ventilation and respiratory function stable Cardiovascular status: blood pressure returned to baseline and stable Postop Assessment: no apparent nausea or vomiting Anesthetic complications: no   No notable events documented.  Last Vitals:  Vitals:   05/25/21 1420 05/25/21 1456  BP: 111/75 127/82  Pulse: 96 98  Resp: 15 18  Temp: (!) 36.2 C 36.5 C  SpO2: 95% 95%    Last Pain:  Vitals:   05/25/21 1456  TempSrc: Oral  PainSc:                  Breah Joa,W. EDMOND

## 2021-05-25 NOTE — Transfer of Care (Signed)
Immediate Anesthesia Transfer of Care Note  Patient: Austin Atkins  Procedure(s) Performed: RIGHT-SIDED LUMBAR 3- LUMBAR 4, LUMBAR 4- LUMBAR 5 TRANSFORAMINAL LUMBAR INTERBODY FUSION AND DECOMPRESSION WITH INSTRUMENTATION AND ALLOGRAFT (Right: Spine Lumbar)  Patient Location: PACU  Anesthesia Type:General  Level of Consciousness: awake, oriented and patient cooperative  Airway & Oxygen Therapy: Patient Spontanous Breathing and Patient connected to face mask oxygen  Post-op Assessment: Report given to RN and Post -op Vital signs reviewed and stable  Post vital signs: Reviewed  Last Vitals:  Vitals Value Taken Time  BP 117/77 05/25/21 1322  Temp    Pulse 105 05/25/21 1327  Resp 13 05/25/21 1327  SpO2 92 % 05/25/21 1327  Vitals shown include unvalidated device data.  Last Pain:  Vitals:   05/25/21 0606  TempSrc: Oral  PainSc:       Patients Stated Pain Goal: 2 (05/25/21 0551)  Complications: No notable events documented.

## 2021-05-25 NOTE — Anesthesia Procedure Notes (Addendum)
Procedure Name: Intubation Date/Time: 05/25/2021 7:53 AM Performed by: Jenne Campus, CRNA Pre-anesthesia Checklist: Patient identified, Emergency Drugs available, Suction available and Patient being monitored Patient Re-evaluated:Patient Re-evaluated prior to induction Oxygen Delivery Method: Circle system utilized Preoxygenation: Pre-oxygenation with 100% oxygen Induction Type: IV induction Ventilation: Mask ventilation with difficulty and Two handed mask ventilation required Laryngoscope Size: Mac and 3 Grade View: Grade III Tube type: Oral Tube size: 7.5 mm Number of attempts: 1 Airway Equipment and Method: Stylet Placement Confirmation: ETT inserted through vocal cords under direct vision, positive ETCO2 and breath sounds checked- equal and bilateral Secured at: 23 cm Tube secured with: Tape Dental Injury: Teeth and Oropharynx as per pre-operative assessment

## 2021-05-25 NOTE — H&P (Signed)
PREOPERATIVE H&P  Chief Complaint: Bilateral leg pain  HPI: Austin Atkins is a 76 y.o. male who presents with ongoing pain in the bilateral legs  MRI reveals stenosis and instability at L3/4 and L4/5  Patient has failed multiple forms of conservative care and continues to have pain (see office notes for additional details regarding the patient's full course of treatment)  Past Medical History:  Diagnosis Date   Arthritis    Atherosclerosis of aorta (HCC)    CAD (coronary artery disease)    Erectile dysfunction    High blood pressure    High cholesterol    Other acute pancreatitis without necrosis or infection    Prediabetes    Past Surgical History:  Procedure Laterality Date   HERNIA REPAIR     Social History   Socioeconomic History   Marital status: Married    Spouse name: Marlowe Kays   Number of children: 1   Years of education: Not on file   Highest education level: Not on file  Occupational History   Occupation: Engineer, site: [employer]  Tobacco Use   Smoking status: Former    Types: Cigarettes    Quit date: 1980    Years since quitting: 43.4   Smokeless tobacco: Never  Vaping Use   Vaping Use: Never used  Substance and Sexual Activity   Alcohol use: Not Currently    Alcohol/week: 2.0 standard drinks    Types: 1 Glasses of wine, 1 Cans of beer per week   Drug use: Never   Sexual activity: Not Currently  Other Topics Concern   Not on file  Social History Narrative   Not on file   Social Determinants of Health   Financial Resource Strain: Low Risk    Difficulty of Paying Living Expenses: Not hard at all  Food Insecurity: No Food Insecurity   Worried About Charity fundraiser in the Last Year: Never true   Ran Out of Food in the Last Year: Never true  Transportation Needs: No Transportation Needs   Lack of Transportation (Medical): No   Lack of Transportation (Non-Medical): No  Physical Activity: Sufficiently Active   Days  of Exercise per Week: 4 days   Minutes of Exercise per Session: 40 min  Stress: Not on file  Social Connections: Not on file   Family History  Problem Relation Age of Onset   Cancer Mother    Heart failure Father    Kidney Stones Father    Hypertension Other    Cancer Other    Not on File Prior to Admission medications   Medication Sig Start Date End Date Taking? Authorizing Provider  aspirin EC 81 MG tablet Take 81 mg by mouth daily.   Yes [provider]  Calcium-Magnesium-Vitamin D (CALCIUM 1200+D3 PO) Take 1 tablet by mouth daily.   Yes [provider]  gabapentin (NEURONTIN) 300 MG capsule Take 600 mg by mouth in the morning, at noon, and at bedtime. 05/11/21  Yes [provider]  HYDROcodone-acetaminophen (NORCO/VICODIN) 5-325 MG tablet Take 1 tablet by mouth every 6 (six) hours as needed for pain. 05/02/21  Yes [provider]  meloxicam (MOBIC) 7.5 MG tablet TAKE 1 TABLET IN THE MORNING AND AT BEDTIME 08/25/20  Yes Cox, Kirsten, MD  Multiple Vitamin (MULTIVITAMIN) capsule Take 1 capsule by mouth daily.   Yes [provider]  simvastatin (ZOCOR) 10 MG tablet TAKE 1 TABLET AT BEDTIME 08/20/20  Yes Cox,  Kirsten, MD  valsartan-hydrochlorothiazide (DIOVAN-HCT) 160-25 MG tablet TAKE 1 TABLET EVERY DAY 08/20/20  Yes Cox, Kirsten, MD  Ascorbic Acid (VITAMIN C) 1000 MG tablet Take 1,000 mg by mouth daily.    [provider]     All other systems have been reviewed and were otherwise negative with the exception of those mentioned in the HPI and as above.  Physical Exam: Vitals:   05/25/21 0606  BP: (!) 144/85  Pulse: 78  Resp: 16  Temp: 98.3 F (36.8 C)  SpO2: 94%    Body mass index is 29.49 kg/m.  General: Alert, no acute distress Cardiovascular: No pedal edema Respiratory: No cyanosis, no use of accessory musculature Skin: No lesions in the area of chief complaint Neurologic: Sensation intact distally Psychiatric:  Patient is competent for consent with normal mood and affect Lymphatic: No axillary or cervical lymphadenopathy   Assessment/Plan: Bilateral leg pain, with an MRI notable for severe stenosis at L3-4 and L4-5, as well as instability Plan for Procedure(s): RIGHT-SIDED LUMBAR 3- LUMBAR 4, LUMBAR 4- LUMBAR 5 TRANSFORAMINAL LUMBAR INTERBODY FUSION AND DECOMPRESSION WITH INSTRUMENTATION AND ALLOGRAFT   Norva Karvonen, MD 05/25/2021 6:40 AM

## 2021-05-26 ENCOUNTER — Encounter (HOSPITAL_COMMUNITY): Payer: Self-pay | Admitting: Orthopedic Surgery

## 2021-05-26 DIAGNOSIS — M5416 Radiculopathy, lumbar region: Secondary | ICD-10-CM | POA: Diagnosis not present

## 2021-05-26 DIAGNOSIS — M2578 Osteophyte, vertebrae: Secondary | ICD-10-CM | POA: Diagnosis not present

## 2021-05-26 DIAGNOSIS — Z87891 Personal history of nicotine dependence: Secondary | ICD-10-CM | POA: Diagnosis not present

## 2021-05-26 DIAGNOSIS — Z7982 Long term (current) use of aspirin: Secondary | ICD-10-CM | POA: Diagnosis not present

## 2021-05-26 DIAGNOSIS — M4316 Spondylolisthesis, lumbar region: Secondary | ICD-10-CM | POA: Diagnosis not present

## 2021-05-26 DIAGNOSIS — Z79899 Other long term (current) drug therapy: Secondary | ICD-10-CM | POA: Diagnosis not present

## 2021-05-26 DIAGNOSIS — M48061 Spinal stenosis, lumbar region without neurogenic claudication: Secondary | ICD-10-CM | POA: Diagnosis not present

## 2021-05-26 DIAGNOSIS — I2510 Atherosclerotic heart disease of native coronary artery without angina pectoris: Secondary | ICD-10-CM | POA: Diagnosis not present

## 2021-05-26 MED ORDER — PHENYLEPHRINE HCL-NACL 20-0.9 MG/250ML-% IV SOLN
INTRAVENOUS | Status: AC
  Filled 2021-05-26: qty 500

## 2021-05-26 MED ORDER — OXYCODONE-ACETAMINOPHEN 5-325 MG PO TABS: 1.0000 | ORAL_TABLET | ORAL | 0 refills | Status: DC | PRN

## 2021-05-26 MED ORDER — METHOCARBAMOL 500 MG PO TABS: 500.0000 mg | ORAL_TABLET | Freq: Four times a day (QID) | ORAL | 2 refills | Status: DC | PRN

## 2021-05-26 NOTE — Progress Notes (Signed)
    Patient doing well  Denies leg pain Has been ambulating   Physical Exam: Vitals:   05/25/21 2243 05/26/21 0346  BP: 95/63 109/69  Pulse: 81 70  Resp: 18 18  Temp: 97.9 F (36.6 C) 97.9 F (36.6 C)  SpO2: 96% 98%    Dressing in place NVI  POD #1 s/p L3-L5 decompression and fusion, doing well  - up with PT/OT, encourage ambulation - Percocet for pain, Robaxin for muscle spasms - likely d/c home today with f/u in 2 weeks

## 2021-05-26 NOTE — Progress Notes (Signed)
Patient alert and oriented, voiding adequately, skin clean, dry and intact without evidence of skin break down, or symptoms of complications - no redness or edema noted, only slight tenderness at site.  Patient states pain is manageable at time of discharge. Patient has an appointment with MD in 2 weeks 

## 2021-05-26 NOTE — Care Management Obs Status (Signed)
[place] NOTIFICATION   Patient Details  Name: Austin Atkins MRN: [id number] Date of Birth: 1945-05-21   Medicare Observation Status Notification Given:  Yes    Pollie Friar, RN 05/26/2021, 9:48 AM

## 2021-05-26 NOTE — Evaluation (Signed)
Physical Therapy Evaluation Patient Details Name: Austin Atkins MRN: [id number] DOB: 10-Mar-1945 Today's Date: 05/26/2021  History of Present Illness  76 yo M adm for L3-L5 PLIF.  PMH includes: Arthritis, Atherosclerosis of aorta, CAD, High blood pressure, High cholesterol, Prediabetes.  Clinical Impression  Prior to admission, pt was independent with all ADLs/IADLs. Pt educated on log roll technique for bed mobility and able to demonstrate understanding. PT verbalized understanding of back precautions with occasional cueing for upright posture during ambulation. Pt required min guard for transfers and ambulation of 367ft with RW. PT was able to negotiate 12 steps with rails and min guard. Pt had no further questions or concerns at this time. Pt does not require acute PT due to level of independence and has verbalized understanding of all education for a safe discharge home.       Recommendations for follow up therapy are one component of a multi-disciplinary discharge planning process, led by the attending physician.  Recommendations may be updated based on patient status, additional functional criteria and insurance authorization.  Follow Up Recommendations Follow physician's recommendations for discharge plan and follow up therapies    Assistance Recommended at Discharge Set up Supervision/Assistance  Patient can return home with the following  A little help with bathing/dressing/bathroom;Assistance with cooking/housework;Assist for transportation;Help with stairs or ramp for entrance    Equipment Recommendations None recommended by PT  Recommendations for Other Services       Functional Status Assessment Patient has had a recent decline in their functional status and demonstrates the ability to make significant improvements in function in a reasonable and predictable amount of time.     Precautions / Restrictions Precautions Precautions: Back Precaution Booklet Issued: Yes  (comment) Required Braces or Orthoses: Spinal Brace Spinal Brace: Thoracolumbosacral orthotic Restrictions Weight Bearing Restrictions: No      Mobility  Bed Mobility Overal bed mobility: Modified Independent             General bed mobility comments: verbal cues for log roll with bed flat and no rails, increased time    Transfers Overall transfer level: Needs assistance Equipment used: Rolling walker (2 wheels) Transfers: Sit to/from Stand Sit to Stand: Min guard           General transfer comment: STS from low bed, cues for hand placement    Ambulation/Gait Ambulation/Gait assistance: Min guard Gait Distance (Feet): 300 Feet Assistive device: Rolling walker (2 wheels) Gait Pattern/deviations: Trunk flexed, Decreased stance time - right Gait velocity: decreased Gait velocity interpretation: 1.31 - 2.62 ft/sec, indicative of limited community ambulator   General Gait Details: verbal cues for upright posture and keeping RW close, L toe out that pt states is always there  Stairs Stairs: Yes Stairs assistance: Min guard Stair Management: Two rails, Step to pattern, One rail Right, Sideways Number of Stairs: 12 General stair comments: Negotiated 10 steps with b rails and step to pattern leading with LLE up. Transitioned to sideways negotiation of 2 steps with R rail which pt was more comfortable with  Wheelchair Mobility    Modified Rankin (Stroke Patients Only)       Balance Overall balance assessment: Mild deficits observed, not formally tested                                           Pertinent Vitals/Pain Pain Assessment Pain Assessment: 0-10 Pain Score: 5  Pain Location: R thigh Pain Descriptors / Indicators: Shooting Pain Intervention(s): Monitored during session    Home Living Family/patient expects to be discharged to:: Private residence Living Arrangements: Spouse/significant other Available Help at Discharge: Available  24 hours/day;Family Type of Home: House Home Access: Stairs to enter Entrance Stairs-Rails: Right;Left;Can reach both Entrance Stairs-Number of Steps: 7   Home Layout: One level Home Equipment: Agricultural consultant (2 wheels);Adaptive equipment;Hand held shower head;Grab bars - tub/shower;Shower seat      Prior Function Prior Level of Function : Independent/Modified Independent                     Hand Dominance   Dominant Hand: Right    Extremity/Trunk Assessment   Upper Extremity Assessment Upper Extremity Assessment: Overall WFL for tasks assessed    Lower Extremity Assessment Lower Extremity Assessment: Overall WFL for tasks assessed    Cervical / Trunk Assessment Cervical / Trunk Assessment: Back Surgery  Communication   Communication: No difficulties  Cognition Arousal/Alertness: Awake/alert Behavior During Therapy: WFL for tasks assessed/performed Overall Cognitive Status: Within Functional Limits for tasks assessed                                          General Comments      Exercises     Assessment/Plan    PT Assessment Patient does not need any further PT services  PT Problem List         PT Treatment Interventions      PT Goals (Current goals can be found in the Care Plan section)  Acute Rehab PT Goals Patient Stated Goal: to return home PT Goal Formulation: All assessment and education complete, DC therapy    Frequency       Co-evaluation               AM-PAC PT "6 Clicks" Mobility  Outcome Measure Help needed turning from your back to your side while in a flat bed without using bedrails?: None Help needed moving from lying on your back to sitting on the side of a flat bed without using bedrails?: None Help needed moving to and from a bed to a chair (including a wheelchair)?: None Help needed standing up from a chair using your arms (e.g., wheelchair or bedside chair)?: None Help needed to walk in hospital  room?: A Little Help needed climbing 3-5 steps with a railing? : A Little 6 Click Score: 22    End of Session Equipment Utilized During Treatment: Back brace;Gait belt Activity Tolerance: Patient tolerated treatment well Patient left: in bed;with call bell/phone within reach;with family/visitor present Nurse Communication: Mobility status PT Visit Diagnosis: Other abnormalities of gait and mobility (R26.89);Difficulty in walking, not elsewhere classified (R26.2)    Time: 8185-6314 PT Time Calculation (min) (ACUTE ONLY): 23 min   Charges:   PT Evaluation $PT Eval Low Complexity: 1 Low PT Treatments $Gait Training: 8-22 mins   Davina Poke, SPT Acute Rehabilitation Services    Davina Poke 05/26/2021, 10:50 AM

## 2021-05-26 NOTE — Evaluation (Signed)
Occupational Therapy Evaluation Patient Details Name: Austin Atkins MRN: [id number] DOB: [birth year] Today's Date: 05/26/2021   History of Present Illness 76 yo M adm for L3-L5 PLIF.  PMH includes: Arthritis, Atherosclerosis of aorta, CAD, High blood pressure, High cholesterol, Prediabetes.   Clinical Impression   Patient admitted for the above procedure.  His spouse can assist as needed, but he is very close to his baseline for ADL completion and in room mobility.  Good understanding of all precautions, and all questions answered.  No further OT needs exist in the acute setting, and no OT anticipated post acute.        Recommendations for follow up therapy are one component of a multi-disciplinary discharge planning process, led by the attending physician.  Recommendations may be updated based on patient status, additional functional criteria and insurance authorization.   Follow Up Recommendations  No OT follow up    Assistance Recommended at Discharge Set up Supervision/Assistance  Patient can return home with the following      Functional Status Assessment  Patient has not had a recent decline in their functional status  Equipment Recommendations  None recommended by OT    Recommendations for Other Services       Precautions / Restrictions Precautions Precautions: Back Precaution Booklet Issued: Yes (comment) Required Braces or Orthoses: Spinal Brace Spinal Brace: Thoracolumbosacral orthotic Restrictions Weight Bearing Restrictions: No      Mobility Bed Mobility Overal bed mobility: Modified Independent                  Transfers Overall transfer level: Modified independent                        Balance Overall balance assessment: Mild deficits observed, not formally tested                                         ADL either performed or assessed with clinical judgement   ADL Overall ADL's : At baseline                                              Vision Patient Visual Report: No change from baseline       Perception Perception Perception: Not tested   Praxis Praxis Praxis: Not tested    Pertinent Vitals/Pain Pain Assessment Pain Assessment: Faces Faces Pain Scale: Hurts a little bit Pain Location: low back Pain Descriptors / Indicators: Aching Pain Intervention(s): Monitored during session     Hand Dominance Right   Extremity/Trunk Assessment Upper Extremity Assessment Upper Extremity Assessment: Overall WFL for tasks assessed   Lower Extremity Assessment Lower Extremity Assessment: Defer to PT evaluation   Cervical / Trunk Assessment Cervical / Trunk Assessment: Back Surgery   Communication Communication Communication: No difficulties   Cognition Arousal/Alertness: Awake/alert Behavior During Therapy: WFL for tasks assessed/performed Overall Cognitive Status: Within Functional Limits for tasks assessed                                       General Comments   VSS on RA    Exercises     Shoulder Instructions  Home Living Family/patient expects to be discharged to:: Private residence Living Arrangements: Spouse/significant other Available Help at Discharge: Available 24 hours/day;Family Type of Home: House Home Access: Stairs to enter [employer] of Steps: 7 Entrance Stairs-Rails: Right;Left;Can reach both Home Layout: One level     Bathroom Shower/Tub: Producer, television/film/video: Standard Bathroom Accessibility: Yes How Accessible: Accessible via walker Home Equipment: Rolling Walker (2 wheels);Adaptive equipment;Hand held shower head;Grab bars - tub/shower;Shower Engineering geologist: Reacher        Prior Functioning/Environment Prior Level of Function : Independent/Modified Independent                        OT Problem List: Pain      OT Treatment/Interventions:      OT Goals(Current  goals can be found in the care plan section) Acute Rehab OT Goals Patient Stated Goal: Return home OT Goal Formulation: With patient Time For Goal Achievement: 05/30/21 Potential to Achieve Goals: Good  OT Frequency:      Co-evaluation              AM-PAC OT "6 Clicks" Daily Activity     Outcome Measure Help from another person eating meals?: None Help from another person taking care of personal grooming?: None Help from another person toileting, which includes using toliet, bedpan, or urinal?: None Help from another person bathing (including washing, rinsing, drying)?: None Help from another person to put on and taking off regular upper body clothing?: None Help from another person to put on and taking off regular lower body clothing?: A Little 6 Click Score: 23   End of Session Equipment Utilized During Treatment: Back brace Nurse Communication: Mobility status  Activity Tolerance: Patient tolerated treatment well Patient left: in bed;with call bell/phone within reach  OT Visit Diagnosis: Pain                Time: 0347-4259 OT Time Calculation (min): 21 min Charges:  OT General Charges $OT Visit: 1 Visit OT Evaluation $OT Eval Moderate Complexity: 1 Mod  05/26/2021  RP, OTR/L  Acute Rehabilitation Services  Office:  [phone]   Suzanna Obey 05/26/2021, 9:30 AM

## 2021-05-26 NOTE — Care Management CC44 (Signed)
Condition Code 44 Documentation Completed  Patient Details  Name: Austin Atkins MRN: [id number] Date of Birth: [birth year]   Condition Code 44 given:  Yes Patient signature on Condition Code 44 notice:  Yes Documentation of 2 MD's agreement:  Yes Code 44 added to claim:  Yes    Pollie Friar, RN 05/26/2021, 9:48 AM

## 2021-05-27 MED FILL — Heparin Sodium (Porcine) Inj 1000 Unit/ML: INTRAMUSCULAR | Qty: 30 | Status: AC

## 2021-05-27 MED FILL — Sodium Chloride IV Soln 0.9%: INTRAVENOUS | Qty: 1000 | Status: AC

## 2021-06-09 NOTE — Discharge Summary (Signed)
Patient ID: STEWART SASAKI MRN: [id number] DOB/AGE: [birth year] 76 y.o.  Admit date: 05/25/2021 Discharge date: 05/26/2021  Admission Diagnoses:  Principal Problem:   Radiculopathy   Discharge Diagnoses:  Same  Past Medical History:  Diagnosis Date   Arthritis    Atherosclerosis of aorta (HCC)    CAD (coronary artery disease)    Erectile dysfunction    High blood pressure    High cholesterol    Other acute pancreatitis without necrosis or infection    Prediabetes     Surgeries: Procedure(s): RIGHT-SIDED LUMBAR 3- LUMBAR 4, LUMBAR 4- LUMBAR 5 TRANSFORAMINAL LUMBAR INTERBODY FUSION AND DECOMPRESSION WITH INSTRUMENTATION AND ALLOGRAFT on 05/25/2021   Consultants: None  Discharged Condition: Improved  Hospital Course: LOGAN BALTIMORE is an 76 y.o. male who was admitted 05/25/2021 for operative treatment of Radiculopathy. Patient has severe unremitting pain that affects sleep, daily activities, and work/hobbies. After pre-op clearance the patient was taken to the operating room on 05/25/2021 and underwent  Procedure(s): RIGHT-SIDED LUMBAR 3- LUMBAR 4, LUMBAR 4- LUMBAR 5 TRANSFORAMINAL LUMBAR INTERBODY FUSION AND DECOMPRESSION WITH INSTRUMENTATION AND ALLOGRAFT.    Patient was given perioperative antibiotics:  Anti-infectives (From admission, onward)    Start     Dose/Rate Route Frequency Ordered Stop   05/25/21 2030  ceFAZolin (ANCEF) IVPB 2g/100 mL premix        2 g 200 mL/hr over 30 Minutes Intravenous Every 8 hours 05/25/21 1456 05/26/21 0345   05/25/21 0600  ceFAZolin (ANCEF) IVPB 2g/100 mL premix        2 g 200 mL/hr over 30 Minutes Intravenous On call to O.R. 05/25/21 0554 05/25/21 1233   05/25/21 0558  ceFAZolin (ANCEF) 2-4 GM/100ML-% IVPB       Note to Pharmacy: Remus Blake: cabinet override      05/25/21 0558 05/25/21 0827        Patient was given sequential compression devices, early ambulation to prevent DVT.  Patient benefited maximally from  hospital stay and there were no complications.    Recent vital signs: BP 109/69 (BP Location: Left Arm)   Pulse 70   Temp 97.9 F (36.6 C) (Oral)   Resp 18   Ht 5' 8.5" (1.74 m)   Wt 89.3 kg   SpO2 98%   BMI 29.49 kg/m    Discharge Medications:   Allergies as of 05/26/2021   Not on File      Medication List     TAKE these medications    aspirin EC 81 MG tablet Take 81 mg by mouth daily.   CALCIUM 1200+D3 PO Take 1 tablet by mouth daily.   gabapentin 300 MG capsule Commonly known as: NEURONTIN Take 600 mg by mouth in the morning, at noon, and at bedtime.   HYDROcodone-acetaminophen 5-325 MG tablet Commonly known as: NORCO/VICODIN Take 1 tablet by mouth every 6 (six) hours as needed for pain.   methocarbamol 500 MG tablet Commonly known as: ROBAXIN Take 1 tablet (500 mg total) by mouth every 6 (six) hours as needed for muscle spasms.   multivitamin capsule Take 1 capsule by mouth daily.   oxyCODONE-acetaminophen 5-325 MG tablet Commonly known as: PERCOCET/ROXICET Take 1-2 tablets by mouth every 4 (four) hours as needed for severe pain.   simvastatin 10 MG tablet Commonly known as: ZOCOR TAKE 1 TABLET AT BEDTIME   valsartan-hydrochlorothiazide 160-25 MG tablet Commonly known as: DIOVAN-HCT TAKE 1 TABLET EVERY DAY   vitamin C 1000 MG tablet Take 1,000 mg  by mouth daily.        Diagnostic Studies: DG Lumbar Spine 2-3 Views  Result Date: 05/25/2021 CLINICAL DATA:  L3-L5 posterior fusion EXAM: LUMBAR SPINE - 2 VIEW COMPARISON:  Radiograph dated May 24th 2023 FINDINGS: Fluoroscopic images were obtained intraoperatively and submitted for post operative interpretation. L3-L5 posterior fusion with hardware in expected position, 3 images were obtained with 60 seconds of fluoroscopy time and 52.3 mGy. Please see the performing provider's procedural report for further detail. IMPRESSION: Intra op fluoroscopic images of L3-L5 posterior fusion. Electronically Signed    By: Allegra Lai M.D.   On: 05/25/2021 13:04   DG C-Arm 1-60 Min-No Report  Result Date: 05/25/2021 Fluoroscopy was utilized by the requesting physician.  No radiographic interpretation.   DG C-Arm 1-60 Min-No Report  Result Date: 05/25/2021 Fluoroscopy was utilized by the requesting physician.  No radiographic interpretation.   DG C-Arm 1-60 Min-No Report  Result Date: 05/25/2021 Fluoroscopy was utilized by the requesting physician.  No radiographic interpretation.   DG C-Arm 1-60 Min-No Report  Result Date: 05/25/2021 Fluoroscopy was utilized by the requesting physician.  No radiographic interpretation.   DG C-Arm 1-60 Min-No Report  Result Date: 05/25/2021 Fluoroscopy was utilized by the requesting physician.  No radiographic interpretation.   DG Lumbar Spine 1 View  Result Date: 05/25/2021 CLINICAL DATA:  Surgical localization. EXAM: LUMBAR SPINE - 1 VIEW COMPARISON:  February 24, 2021.  March 16, 2021. FINDINGS: Single intraoperative cross-table lateral projection was obtained of the lumbar spine. This image demonstrates surgical probes directed toward the posterior spinous process of L3 as well as the posterior interspinous space of L4-5. IMPRESSION: Surgical localization as described above. Electronically Signed   By: Lupita Raider M.D.   On: 05/25/2021 09:23    Disposition: Discharge disposition: 01-Home or Self Care        POD #1 s/p L3-L5 decompression and fusion, doing well   - up with PT/OT, encourage ambulation - Percocet for pain, Robaxin for muscle spasms -Scripts for pain sent to pharmacy electronically  -D/C instructions sheet printed and in chart -D/C today  -F/U in office 2 weeks   Signed: Georga Bora 06/09/2021, 9:46 AM

## 2021-06-22 ENCOUNTER — Other Ambulatory Visit: Payer: Self-pay | Admitting: Family Medicine

## 2021-07-08 DIAGNOSIS — M4326 Fusion of spine, lumbar region: Secondary | ICD-10-CM | POA: Diagnosis not present

## 2021-08-09 ENCOUNTER — Ambulatory Visit: Payer: Medicare PPO | Admitting: Family Medicine

## 2021-08-09 ENCOUNTER — Encounter: Payer: Self-pay | Admitting: Family Medicine

## 2021-08-09 VITALS — BP 166/86 | HR 80 | Temp 97.3°F | Resp 14 | Ht 68.5 in | Wt 198.0 lb

## 2021-08-09 DIAGNOSIS — R7301 Impaired fasting glucose: Secondary | ICD-10-CM

## 2021-08-09 DIAGNOSIS — I10 Essential (primary) hypertension: Secondary | ICD-10-CM

## 2021-08-09 NOTE — Progress Notes (Signed)
Subjective:  Patient ID: Austin Atkins, male    DOB: 11-Mar-1945  Age: 76 y.o. MRN: [id number]  Chief Complaint  Patient presents with   Hypertension   HPI Uncontrolled hypertension.  Patient reports that his bp has been elevated for several months.  He recently had back surgery which relieved his severe pain that he had been experiencing for months but his bp has remained elevated.   He is asymptomatic.   Current Outpatient Medications on File Prior to Visit  Medication Sig Dispense Refill   meloxicam (MOBIC) 7.5 MG tablet TAKE 1 TABLET IN THE MORNING AND AT BEDTIME (Patient taking differently: Take 15 mg by mouth daily.) 180 tablet 0   Multiple Vitamin (MULTIVITAMIN) capsule Take 1 capsule by mouth daily.     simvastatin (ZOCOR) 10 MG tablet TAKE 1 TABLET AT BEDTIME 90 tablet 1   valsartan-hydrochlorothiazide (DIOVAN-HCT) 160-25 MG tablet TAKE 1 TABLET EVERY DAY 90 tablet 1   Ascorbic Acid (VITAMIN C) 1000 MG tablet Take 1,000 mg by mouth daily.     aspirin EC 81 MG tablet Take 81 mg by mouth daily.     Calcium-Magnesium-Vitamin D (CALCIUM 1200+D3 PO) Take 1 tablet by mouth daily.     No current facility-administered medications on file prior to visit.   Past Medical History:  Diagnosis Date   Arthritis    Atherosclerosis of aorta (HCC)    CAD (coronary artery disease)    Erectile dysfunction    High blood pressure    High cholesterol    Other acute pancreatitis without necrosis or infection    Prediabetes    Past Surgical History:  Procedure Laterality Date   HERNIA REPAIR     TRANSFORAMINAL LUMBAR INTERBODY FUSION (TLIF) WITH PEDICLE SCREW FIXATION 2 LEVEL Right 05/25/2021   Procedure: RIGHT-SIDED LUMBAR 3- LUMBAR 4, LUMBAR 4- LUMBAR 5 TRANSFORAMINAL LUMBAR INTERBODY FUSION AND DECOMPRESSION WITH INSTRUMENTATION AND ALLOGRAFT;  Surgeon: Estill Bamberg, MD;  Location: [facility] OR;  Service: Orthopedics;  Laterality: Right;    Family History  Problem Relation Age of Onset    Cancer Mother    Heart failure Father    Kidney Stones Father    Hypertension Other    Cancer Other    Social History   Socioeconomic History   Marital status: Married    Spouse name: Junious Dresser   Number of children: 1   Years of education: Not on file   Highest education level: Not on file  Occupational History   Occupation: Magazine features editor: [employer]  Tobacco Use   Smoking status: Former    Types: Cigarettes    Quit date: 1980    Years since quitting: 43.6   Smokeless tobacco: Never  Vaping Use   Vaping Use: Never used  Substance and Sexual Activity   Alcohol use: Not Currently    Alcohol/week: 2.0 standard drinks of alcohol    Types: 1 Glasses of wine, 1 Cans of beer per week   Drug use: Never   Sexual activity: Not Currently  Other Topics Concern   Not on file  Social History Narrative   Not on file   Social Determinants of Health   Financial Resource Strain: Low Risk  (09/14/2020)   Overall Financial Resource Strain (CARDIA)    Difficulty of Paying Living Expenses: Not hard at all  Food Insecurity: No Food Insecurity (10/21/2020)   Hunger Vital Sign    Worried About Running Out of Food in the Last Year: Never  true    Ran Out of Food in the Last Year: Never true  Transportation Needs: No Transportation Needs (09/14/2020)   PRAPARE - Administrator, Civil Service (Medical): No    Lack of Transportation (Non-Medical): No  Physical Activity: Sufficiently Active (10/21/2020)   Exercise Vital Sign    Days of Exercise per Week: 4 days    Minutes of Exercise per Session: 40 min  Stress: Not on file  Social Connections: Not on file    Review of Systems  Constitutional:  Negative for chills and fever.  Eyes:  Negative for visual disturbance.  Respiratory:  Negative for cough.   Cardiovascular:  Negative for chest pain.  Neurological:  Negative for headaches.  Psychiatric/Behavioral:  Negative for dysphoric mood. The patient is not  nervous/anxious.      Objective:  BP (!) 166/86   Pulse 80   Temp (!) 97.3 F (36.3 C)   Resp 14   Ht 5' 8.5" (1.74 m)   Wt 198 lb (89.8 kg)   BMI 29.67 kg/m      08/09/2021    4:02 PM 05/26/2021    3:46 AM 05/25/2021   10:43 PM  BP/Weight  Systolic BP 166 109 95  Diastolic BP 86 69 63  Wt. (Lbs) 198    BMI 29.67 kg/m2      Physical Exam Vitals reviewed.  Constitutional:      Appearance: Normal appearance.  Neck:     Vascular: No carotid bruit.  Cardiovascular:     Rate and Rhythm: Normal rate and regular rhythm.     Pulses: Normal pulses.     Heart sounds: Normal heart sounds.  Pulmonary:     Effort: Pulmonary effort is normal.     Breath sounds: Normal breath sounds. No wheezing, rhonchi or rales.  Abdominal:     General: Bowel sounds are normal.     Palpations: Abdomen is soft.     Tenderness: There is no abdominal tenderness.  Neurological:     Mental Status: He is alert.  Psychiatric:        Mood and Affect: Mood normal.        Behavior: Behavior normal.     Diabetic Foot Exam - Simple   No data filed      Lab Results  Component Value Date   WBC 6.4 08/09/2021   HGB 15.4 08/09/2021   HCT 46.0 08/09/2021   PLT 203 08/09/2021   GLUCOSE 99 08/09/2021   CHOL 136 10/21/2020   TRIG 75 10/21/2020   HDL 48 10/21/2020   LDLCALC 73 10/21/2020   ALT 36 08/09/2021   AST 32 08/09/2021   NA 142 08/09/2021   K 3.9 08/09/2021   CL 104 08/09/2021   CREATININE 0.98 08/09/2021   BUN 19 08/09/2021   CO2 26 08/09/2021   TSH 2.650 08/09/2021   HGBA1C 5.9 (H) 08/09/2021      Assessment & Plan:   Problem List Items Addressed This Visit       Cardiovascular and Mediastinum   Essential hypertension, benign - Primary    Increase valsartan/hydrochlorothiazide 160/25 mg 2 daily in am.      Relevant Orders   CBC with Differential/Platelet (Completed)   Comprehensive metabolic panel (Completed)   TSH (Completed)     Endocrine   Impaired fasting  glucose   Relevant Orders   Hemoglobin A1c (Completed)  .  No orders of the defined types were placed in this encounter.  Orders Placed This Encounter  Procedures   CBC with Differential/Platelet   Comprehensive metabolic panel   Hemoglobin A1c   TSH     Follow-up: Return in about 6 weeks (around 09/20/2021) for chronic fasting.  An After Visit Summary was printed and given to the patient.  Blane Ohara, MD [facility] [phone]

## 2021-08-09 NOTE — Patient Instructions (Signed)
Increase valsartan/hydrochlorothiazide 160/25 mg 2 daily in am.

## 2021-08-10 LAB — COMPREHENSIVE METABOLIC PANEL
ALT: 36 IU/L (ref 0–44)
AST: 32 IU/L (ref 0–40)
Albumin/Globulin Ratio: 2.1 (ref 1.2–2.2)
Albumin: 4.1 g/dL (ref 3.8–4.8)
Alkaline Phosphatase: 129 IU/L — ABNORMAL HIGH (ref 44–121)
BUN/Creatinine Ratio: 19 (ref 10–24)
BUN: 19 mg/dL (ref 8–27)
Bilirubin Total: 0.7 mg/dL (ref 0.0–1.2)
CO2: 26 mmol/L (ref 20–29)
Calcium: 9.3 mg/dL (ref 8.6–10.2)
Chloride: 104 mmol/L (ref 96–106)
Creatinine, Ser: 0.98 mg/dL (ref 0.76–1.27)
Globulin, Total: 2 g/dL (ref 1.5–4.5)
Glucose: 99 mg/dL (ref 70–99)
Potassium: 3.9 mmol/L (ref 3.5–5.2)
Sodium: 142 mmol/L (ref 134–144)
Total Protein: 6.1 g/dL (ref 6.0–8.5)
eGFR: 81 mL/min/{1.73_m2} (ref >59)

## 2021-08-10 LAB — CBC WITH DIFFERENTIAL/PLATELET
Basophils Absolute: 0.1 10*3/uL (ref 0.0–0.2)
Basos: 1 %
EOS (ABSOLUTE): 0.2 10*3/uL (ref 0.0–0.4)
Eos: 3 %
Hematocrit: 46 % (ref 37.5–51.0)
Hemoglobin: 15.4 g/dL (ref 13.0–17.7)
Immature Grans (Abs): 0 10*3/uL (ref 0.0–0.1)
Immature Granulocytes: 0 %
Lymphocytes Absolute: 2 10*3/uL (ref 0.7–3.1)
Lymphs: 31 %
MCH: 29.6 pg (ref 26.6–33.0)
MCHC: 33.5 g/dL (ref 31.5–35.7)
MCV: 89 fL (ref 79–97)
Monocytes Absolute: 0.5 10*3/uL (ref 0.1–0.9)
Monocytes: 8 %
Neutrophils Absolute: 3.7 10*3/uL (ref 1.4–7.0)
Neutrophils: 57 %
Platelets: 203 10*3/uL (ref 150–450)
RBC: 5.2 x10E6/uL (ref 4.14–5.80)
RDW: 12.4 % (ref 11.6–15.4)
WBC: 6.4 10*3/uL (ref 3.4–10.8)

## 2021-08-10 LAB — TSH: TSH: 2.65 u[IU]/mL (ref 0.450–4.500)

## 2021-08-10 LAB — HEMOGLOBIN A1C
Est. average glucose Bld gHb Est-mCnc: 123 mg/dL
Hgb A1c MFr Bld: 5.9 % — ABNORMAL HIGH (ref 4.8–5.6)

## 2021-08-14 NOTE — Assessment & Plan Note (Signed)
Increase valsartan/hydrochlorothiazide 160/25 mg 2 daily in am.  

## 2021-08-23 DIAGNOSIS — M4326 Fusion of spine, lumbar region: Secondary | ICD-10-CM | POA: Diagnosis not present

## 2021-08-23 DIAGNOSIS — M6281 Muscle weakness (generalized): Secondary | ICD-10-CM | POA: Diagnosis not present

## 2021-08-23 DIAGNOSIS — M545 Low back pain, unspecified: Secondary | ICD-10-CM | POA: Diagnosis not present

## 2021-08-25 DIAGNOSIS — M545 Low back pain, unspecified: Secondary | ICD-10-CM | POA: Diagnosis not present

## 2021-08-25 DIAGNOSIS — M4326 Fusion of spine, lumbar region: Secondary | ICD-10-CM | POA: Diagnosis not present

## 2021-08-25 DIAGNOSIS — M6281 Muscle weakness (generalized): Secondary | ICD-10-CM | POA: Diagnosis not present

## 2021-08-30 DIAGNOSIS — M545 Low back pain, unspecified: Secondary | ICD-10-CM | POA: Diagnosis not present

## 2021-08-30 DIAGNOSIS — M4326 Fusion of spine, lumbar region: Secondary | ICD-10-CM | POA: Diagnosis not present

## 2021-08-30 DIAGNOSIS — M6281 Muscle weakness (generalized): Secondary | ICD-10-CM | POA: Diagnosis not present

## 2021-09-01 DIAGNOSIS — M4326 Fusion of spine, lumbar region: Secondary | ICD-10-CM | POA: Diagnosis not present

## 2021-09-01 DIAGNOSIS — M6281 Muscle weakness (generalized): Secondary | ICD-10-CM | POA: Diagnosis not present

## 2021-09-01 DIAGNOSIS — M545 Low back pain, unspecified: Secondary | ICD-10-CM | POA: Diagnosis not present

## 2021-09-06 ENCOUNTER — Telehealth: Payer: Self-pay

## 2021-09-06 ENCOUNTER — Ambulatory Visit: Payer: Medicare PPO | Admitting: Nurse Practitioner

## 2021-09-06 ENCOUNTER — Encounter: Payer: Self-pay | Admitting: Nurse Practitioner

## 2021-09-06 VITALS — Temp 97.9°F | Ht 68.5 in | Wt 196.0 lb

## 2021-09-06 DIAGNOSIS — H8110 Benign paroxysmal vertigo, unspecified ear: Secondary | ICD-10-CM | POA: Diagnosis not present

## 2021-09-06 MED ORDER — ONDANSETRON HCL 4 MG PO TABS: 4.0000 mg | ORAL_TABLET | Freq: Three times a day (TID) | ORAL | 1 refills | Status: DC | PRN

## 2021-09-06 MED ORDER — MECLIZINE HCL 25 MG PO TABS: 25.0000 mg | ORAL_TABLET | Freq: Three times a day (TID) | ORAL | 0 refills | Status: DC | PRN

## 2021-09-06 NOTE — Patient Instructions (Addendum)
Take Meclizine 25 mg up to 3 times daily as needed for vertigo Take Zofran 4 mg as needed for nausea Perform Epley Maneuvers (Vertigo exercises) twice daily We will call you with physical therapy appointment Follow-up as needed  Benign Positional Vertigo Vertigo is the feeling that you or your surroundings are moving when they are not. Benign positional vertigo is the most common form of vertigo. This is usually a harmless condition (benign). This condition is positional. This means that symptoms are triggered by certain movements and positions. This condition can be dangerous if it occurs while you are doing something that could cause harm to yourself or others. This includes activities such as driving or operating machinery. What are the causes? The inner ear has fluid-filled canals that help your brain sense movement and balance. When the fluid moves, the brain receives messages about your body's position. With benign positional vertigo, calcium crystals in the inner ear break free and disturb the inner ear area. This causes your brain to receive confusing messages about your body's position. What increases the risk? You are more likely to develop this condition if: You are a woman. You are 24 years of age or older. You have recently had a head injury. You have an inner ear disease. What are the signs or symptoms? Symptoms of this condition usually happen when you move your head or your eyes in different directions. Symptoms may start suddenly and usually last for less than a minute. They include: Loss of balance and falling. Feeling like you are spinning or moving. Feeling like your surroundings are spinning or moving. Nausea and vomiting. Blurred vision. Dizziness. Involuntary eye movement (nystagmus). Symptoms can be mild and cause only minor problems, or they can be severe and interfere with daily life. Episodes of benign positional vertigo may return (recur) over time. Symptoms may  also improve over time. How is this diagnosed? This condition may be diagnosed based on: Your medical history. A physical exam of the head, neck, and ears. Positional tests to check for or stimulate vertigo. You may be asked to turn your head and change positions, such as going from sitting to lying down. A health care provider will watch for symptoms of vertigo. You may be referred to a health care provider who specializes in ear, nose, and throat problems (ENT or otolaryngologist) or a provider who specializes in disorders of the nervous system (neurologist). How is this treated?  This condition may be treated in a session in which your health care provider moves your head in specific positions to help the displaced crystals in your inner ear move. Treatment for this condition may take several sessions. Surgery may be needed in severe cases, but this is rare. In some cases, benign positional vertigo may resolve on its own in 2-4 weeks. Follow these instructions at home: Safety Move slowly. Avoid sudden body or head movements or certain positions, as told by your health care provider. Avoid driving or operating machinery until your health care provider says it is safe. Avoid doing any tasks that would be dangerous to you or others if vertigo occurs. If you have trouble walking or keeping your balance, try using a cane for stability. If you feel dizzy or unstable, sit down right away. Return to your normal activities as told by your health care provider. Ask your health care provider what activities are safe for you. General instructions Take over-the-counter and prescription medicines only as told by your health care provider. Drink enough fluid  to keep your urine pale yellow. Keep all follow-up visits. This is important. Contact a health care provider if: You have a fever. Your condition gets worse or you develop new symptoms. Your family or friends notice any behavioral changes. You have  nausea or vomiting that gets worse. You have numbness or a prickling and tingling sensation. Get help right away if you: Have difficulty speaking or moving. Are always dizzy or faint. Develop severe headaches. Have weakness in your legs or arms. Have changes in your hearing or vision. Develop a stiff neck. Develop sensitivity to light. These symptoms may represent a serious problem that is an emergency. Do not wait to see if the symptoms will go away. Get medical help right away. Call your local emergency services (911 in the [place] Do not drive yourself to the hospital. Summary Vertigo is the feeling that you or your surroundings are moving when they are not. Benign positional vertigo is the most common form of vertigo. This condition is caused by calcium crystals in the inner ear that become displaced. This causes a disturbance in an area of the inner ear that helps your brain sense movement and balance. Symptoms include loss of balance and falling, feeling that you or your surroundings are moving, nausea and vomiting, and blurred vision. This condition can be diagnosed based on symptoms, a physical exam, and positional tests. Follow safety instructions as told by your health care provider and keep all follow-up visits. This is important. This information is not intended to replace advice given to you by your health care provider. Make sure you discuss any questions you have with your health care provider. Document Revised: 11/19/2019 Document Reviewed: 11/19/2019 Elsevier Patient Education  [place]. How to Perform the Epley Maneuver The Epley maneuver is an exercise that relieves symptoms of vertigo. Vertigo is the feeling that you or your surroundings are moving when they are not. When you feel vertigo, you may feel like the room is spinning and may have trouble walking. The Epley maneuver is used for a type of vertigo caused by a calcium deposit in a part of the inner ear. The  maneuver involves changing head positions to help the deposit move out of the area. You can do this maneuver at home whenever you have symptoms of vertigo. You can repeat it in 24 hours if your vertigo has not gone away. Even though the Epley maneuver may relieve your vertigo for a few weeks, it is possible that your symptoms will return. This maneuver relieves vertigo, but it does not relieve dizziness. What are the risks? If it is done correctly, the Epley maneuver is considered safe. Sometimes it can lead to dizziness or nausea that goes away after a short time. If you develop other symptoms--such as changes in vision, weakness, or numbness--stop doing the maneuver and call your health care provider. Supplies needed: A bed or table. A pillow. How to do the Epley maneuver     Sit on the edge of a bed or table with your back straight and your legs extended or hanging over the edge of the bed or table. Turn your head halfway toward the affected ear or side as told by your health care provider. Lie backward quickly with your head turned until you are lying flat on your back. Your head should dangle (head-hanging position). You may want to position a pillow under your shoulders. Hold this position for at least 30 seconds. If you feel dizzy or have symptoms  of vertigo, continue to hold the position until the symptoms stop. Turn your head to the opposite direction until your unaffected ear is facing down. Your head should continue to dangle. Hold this position for at least 30 seconds. If you feel dizzy or have symptoms of vertigo, continue to hold the position until the symptoms stop. Turn your whole body to the same side as your head so that you are positioned on your side. Your head will now be nearly facedown and no longer needs to dangle. Hold for at least 30 seconds. If you feel dizzy or have symptoms of vertigo, continue to hold the position until the symptoms stop. Sit back up. You can repeat  the maneuver in 24 hours if your vertigo does not go away. Follow these instructions at home: For 24 hours after doing the Epley maneuver: Keep your head in an upright position. When lying down to sleep or rest, keep your head raised (elevated) with two or more pillows. Avoid excessive neck movements. Activity Do not drive or use machinery if you feel dizzy. After doing the Epley maneuver, return to your normal activities as told by your health care provider. Ask your health care provider what activities are safe for you. General instructions Drink enough fluid to keep your urine pale yellow. Do not drink alcohol. Take over-the-counter and prescription medicines only as told by your health care provider. Keep all follow-up visits. This is important. Preventing vertigo symptoms Ask your health care provider if there is anything you should do at home to prevent vertigo. He or she may recommend that you: Keep your head elevated with two or more pillows while you sleep. Do not sleep on the side of your affected ear. Get up slowly from bed. Avoid sudden movements during the day. Avoid extreme head positions or movement, such as looking up or bending over. Contact a health care provider if: Your vertigo gets worse. You have other symptoms, including: Nausea. Vomiting. Headache. Get help right away if you: Have vision changes. Have a headache or neck pain that is severe or getting worse. Cannot stop vomiting. Have new numbness or weakness in any part of your body. These symptoms may represent a serious problem that is an emergency. Do not wait to see if the symptoms will go away. Get medical help right away. Call your local emergency services (911 in the [place] Do not drive yourself to the hospital. Summary Vertigo is the feeling that you or your surroundings are moving when they are not. The Epley maneuver is an exercise that relieves symptoms of vertigo. If the Epley maneuver is done  correctly, it is considered safe. This information is not intended to replace advice given to you by your health care provider. Make sure you discuss any questions you have with your health care provider. Document Revised: 11/19/2019 Document Reviewed: 11/19/2019 Elsevier Patient Education  2023 [employer].

## 2021-09-06 NOTE — Progress Notes (Signed)
Acute Office Visit  Subjective:    Patient ID: Austin Atkins, male    DOB: 22-Feb-1945, 76 y.o.   MRN: [id number]  Chief Complaint  Patient presents with   Dizziness    When going from laying to standing.     Patient is in today for Dizziness. Patient states that it occurs when going from laying to sitting up to standing. He states that it started two days ago, and it has re-occurred again yesterday and today. He states that when he feels like this he feels like he is falling.    Dizziness  He reports new onset dizziness. He describes it as feeling light headed, feeling like patient is spinning, and feeling unbalanced, occurs  with lying down, sitting in recliner, and during position changes , and typically lasts a few seconds.  It typically occurs when he is  supine or lying on his side . It is usually relieved by sitting still. He has not started new medications around the time the dizziness started. Pt states he had BP medications adjusted by PCP a few months ago.   Wt Readings from Last 3 Encounters:  09/06/21 196 lb (88.9 kg)  08/09/21 198 lb (89.8 kg)  05/25/21 196 lb 12.8 oz (89.3 kg)    BP Readings from Last 3 Encounters:  08/09/21 (!) 166/86  05/26/21 109/69  05/18/21 126/84      Lab Results  Component Value Date   WBC 6.4 08/09/2021   HGB 15.4 08/09/2021   HCT 46.0 08/09/2021   MCV 89 08/09/2021   PLT 203 08/09/2021   Lab Results  Component Value Date   NA 142 08/09/2021   K 3.9 08/09/2021   CO2 26 08/09/2021   BUN 19 08/09/2021   CREATININE 0.98 08/09/2021   CALCIUM 9.3 08/09/2021   GLUCOSE 99 08/09/2021       Past Medical History:  Diagnosis Date   Arthritis    Atherosclerosis of aorta (HCC)    CAD (coronary artery disease)    Erectile dysfunction    High blood pressure    High cholesterol    Other acute pancreatitis without necrosis or infection    Prediabetes     Past Surgical History:  Procedure Laterality Date   HERNIA REPAIR      TRANSFORAMINAL LUMBAR INTERBODY FUSION (TLIF) WITH PEDICLE SCREW FIXATION 2 LEVEL Right 05/25/2021   Procedure: RIGHT-SIDED LUMBAR 3- LUMBAR 4, LUMBAR 4- LUMBAR 5 TRANSFORAMINAL LUMBAR INTERBODY FUSION AND DECOMPRESSION WITH INSTRUMENTATION AND ALLOGRAFT;  Surgeon: Phylliss Bob, MD;  Location: [facility];  Service: Orthopedics;  Laterality: Right;    Family History  Problem Relation Age of Onset   Cancer Mother    Heart failure Father    Kidney Stones Father    Hypertension Other    Cancer Other     Social History   Socioeconomic History   Marital status: Married    Spouse name: Marlowe Kays   Number of children: 1   Years of education: Not on file   Highest education level: Not on file  Occupational History   Occupation: Engineer, site: [employer]  Tobacco Use   Smoking status: Former    Types: Cigarettes    Quit date: 1980    Years since quitting: 43.7   Smokeless tobacco: Never  Vaping Use   Vaping Use: Never used  Substance and Sexual Activity   Alcohol use: Not Currently    Alcohol/week: 2.0 standard drinks of alcohol  Types: 1 Glasses of wine, 1 Cans of beer per week   Drug use: Never   Sexual activity: Not Currently  Other Topics Concern   Not on file  Social History Narrative   Not on file   Social Determinants of Health   Financial Resource Strain: Low Risk  (09/14/2020)   Overall Financial Resource Strain (CARDIA)    Difficulty of Paying Living Expenses: Not hard at all  Food Insecurity: No Food Insecurity (10/21/2020)   Hunger Vital Sign    Worried About Running Out of Food in the Last Year: Never true    Ran Out of Food in the Last Year: Never true  Transportation Needs: No Transportation Needs (09/14/2020)   PRAPARE - Hydrologist (Medical): No    Lack of Transportation (Non-Medical): No  Physical Activity: Sufficiently Active (10/21/2020)   Exercise Vital Sign    Days of Exercise per Week: 4 days    Minutes  of Exercise per Session: 40 min  Stress: Not on file  Social Connections: Not on file  Intimate Partner Violence: Not on file    Outpatient Medications Prior to Visit  Medication Sig Dispense Refill   Ascorbic Acid (VITAMIN C) 1000 MG tablet Take 1,000 mg by mouth daily.     aspirin EC 81 MG tablet Take 81 mg by mouth daily.     Calcium-Magnesium-Vitamin D (CALCIUM 1200+D3 PO) Take 1 tablet by mouth daily.     meloxicam (MOBIC) 7.5 MG tablet TAKE 1 TABLET IN THE MORNING AND AT BEDTIME (Patient taking differently: Take 15 mg by mouth daily.) 180 tablet 0   Multiple Vitamin (MULTIVITAMIN) capsule Take 1 capsule by mouth daily.     simvastatin (ZOCOR) 10 MG tablet TAKE 1 TABLET AT BEDTIME 90 tablet 1   valsartan-hydrochlorothiazide (DIOVAN-HCT) 160-25 MG tablet TAKE 1 TABLET EVERY DAY 90 tablet 1   No facility-administered medications prior to visit.    No Known Allergies  Review of Systems  Constitutional:  Negative for appetite change, fatigue and fever.  HENT:  Negative for congestion, ear pain, sinus pressure and sore throat.   Respiratory:  Negative for cough, chest tightness, shortness of breath and wheezing.   Cardiovascular:  Negative for chest pain and palpitations.  Gastrointestinal:  Negative for abdominal pain, constipation, diarrhea, nausea and vomiting.  Genitourinary:  Negative for dysuria and hematuria.  Musculoskeletal:  Positive for gait problem (related to dizziness). Negative for arthralgias, back pain, joint swelling and myalgias.  Skin:  Negative for rash.  Neurological:  Positive for dizziness. Negative for weakness and headaches.  Psychiatric/Behavioral:  Negative for dysphoric mood. The patient is not nervous/anxious.        Objective:    Physical Exam Vitals reviewed.  Constitutional:      Appearance: Normal appearance.  HENT:     Right Ear: Tympanic membrane normal.     Left Ear: Tympanic membrane normal.     Nose: Nose normal.     Mouth/Throat:      Mouth: Mucous membranes are moist.  Eyes:     General: No visual field deficit.    Comments: Vertical nystagmus noted with Dix-Hallpike Maneuver  Cardiovascular:     Rate and Rhythm: Normal rate and regular rhythm.     Pulses: Normal pulses.     Heart sounds: Normal heart sounds.  Pulmonary:     Effort: Pulmonary effort is normal.     Breath sounds: Normal breath sounds.  Musculoskeletal:  Cervical back: Normal range of motion.  Skin:    General: Skin is warm and dry.     Capillary Refill: Capillary refill takes less than 2 seconds.  Neurological:     General: No focal deficit present.     Mental Status: He is alert and oriented to person, place, and time.     Cranial Nerves: No facial asymmetry.     Motor: No weakness or pronator drift.     Coordination: Romberg sign negative. Coordination normal. Finger-Nose-Finger Test and Heel to Shin Test normal.     Gait: Tandem walk abnormal. Gait (tandem gait impaired) normal.     Deep Tendon Reflexes: Reflexes normal.  Psychiatric:        Mood and Affect: Mood normal.        Behavior: Behavior normal.     Temp 97.9 F (36.6 C) (Temporal)   Ht 5' 8.5" (1.74 m)   Wt 196 lb (88.9 kg)   SpO2 93%   BMI 29.37 kg/m   Orthostatic VS for the past 72 hrs (Last 3 readings):  Orthostatic BP Patient Position BP Location Orthostatic Pulse  09/06/21 1602 (!) 142/92 Standing Left Arm 87  09/06/21 1601 (!) 136/98 Sitting Left Arm 85  09/06/21 1559 112/78 Supine Left Arm 82       Wt Readings from Last 3 Encounters:  09/06/21 196 lb (88.9 kg)  08/09/21 198 lb (89.8 kg)  05/25/21 196 lb 12.8 oz (89.3 kg)    Health Maintenance Due  Topic Date Due   TETANUS/TDAP  12/06/2020   COVID-19 Vaccine (4 - Moderna series) 04/15/2021   INFLUENZA VACCINE  08/02/2021       Lab Results  Component Value Date   TSH 2.650 08/09/2021   Lab Results  Component Value Date   WBC 6.4 08/09/2021   HGB 15.4 08/09/2021   HCT 46.0 08/09/2021   MCV  89 08/09/2021   PLT 203 08/09/2021   Lab Results  Component Value Date   NA 142 08/09/2021   K 3.9 08/09/2021   CO2 26 08/09/2021   GLUCOSE 99 08/09/2021   BUN 19 08/09/2021   CREATININE 0.98 08/09/2021   BILITOT 0.7 08/09/2021   ALKPHOS 129 (H) 08/09/2021   AST 32 08/09/2021   ALT 36 08/09/2021   PROT 6.1 08/09/2021   ALBUMIN 4.1 08/09/2021   CALCIUM 9.3 08/09/2021   ANIONGAP 6 05/18/2021   EGFR 81 08/09/2021   Lab Results  Component Value Date   CHOL 136 10/21/2020   Lab Results  Component Value Date   HDL 48 10/21/2020   Lab Results  Component Value Date   LDLCALC 73 10/21/2020   Lab Results  Component Value Date   TRIG 75 10/21/2020   Lab Results  Component Value Date   CHOLHDL 2.8 10/21/2020   Lab Results  Component Value Date   HGBA1C 5.9 (H) 08/09/2021         Assessment & Plan:   1. Benign paroxysmal positional vertigo, unspecified laterality - meclizine (ANTIVERT) 25 MG tablet; Take 1 tablet (25 mg total) by mouth 3 (three) times daily as needed for dizziness.  Dispense: 30 tablet; Refill: 0 - ondansetron (ZOFRAN) 4 MG tablet; Take 1 tablet (4 mg total) by mouth every 8 (eight) hours as needed for nausea or vomiting.  Dispense: 30 tablet; Refill: 1 - Ambulatory referral to Physical Therapy   I, Rip Harbour, NP, have reviewed all documentation for this visit. The documentation on 09/06/21 for the  exam, diagnosis, procedures, and orders are all accurate and complete.   I,Lauren M Auman,acting as a Education administrator for [employer], NP.,have documented all relevant documentation on the behalf of Rip Harbour, NP,as directed by  Rip Harbour, NP while in the presence of Rip Harbour, NP.   Oleta Mouse, CMA  Signed, Jerrell Belfast, DNP 09/06/21 at 8:47 pm

## 2021-09-06 NOTE — Telephone Encounter (Signed)
Patient is complain of being dizzy that started Sunday morning, patient stated he is dizzy once he gets up and once he start walking it goes away. He checked his BP pressure this morning and it was 130/81 with a heart rate of 74. Patient denies chest pain, and any numbness. Patient was put on shannon schedule to come in at 3:40 pm and was told if sxs get worst to go to the nearest hospital.

## 2021-09-26 ENCOUNTER — Encounter: Payer: Self-pay | Admitting: Family Medicine

## 2021-09-26 ENCOUNTER — Ambulatory Visit: Payer: Medicare PPO | Admitting: Family Medicine

## 2021-09-26 VITALS — BP 142/78 | HR 80 | Temp 97.3°F | Resp 14 | Ht 68.5 in | Wt 200.0 lb

## 2021-09-26 DIAGNOSIS — H8110 Benign paroxysmal vertigo, unspecified ear: Secondary | ICD-10-CM | POA: Insufficient documentation

## 2021-09-26 DIAGNOSIS — Z23 Encounter for immunization: Secondary | ICD-10-CM | POA: Diagnosis not present

## 2021-09-26 DIAGNOSIS — I10 Essential (primary) hypertension: Secondary | ICD-10-CM

## 2021-09-26 DIAGNOSIS — H8113 Benign paroxysmal vertigo, bilateral: Secondary | ICD-10-CM

## 2021-09-26 HISTORY — DX: Benign paroxysmal vertigo, unspecified ear: H81.10

## 2021-09-26 MED ORDER — AMLODIPINE BESYLATE 2.5 MG PO TABS: 2.5000 mg | ORAL_TABLET | Freq: Every day | ORAL | 0 refills | Status: DC

## 2021-09-26 NOTE — Progress Notes (Addendum)
Patient ID: Austin Atkins, male    DOB: [birth year]  Age: 76 y.o. MRN: [id number]  Chief Complaint  Patient presents with   Hypertension    HPI Hypertension:  uncontrolled: Valsartan/HCT 160/25 mg increased to twice daily.  Bps at home 114-158/80-90s, Pulse 70-80s.  BPPV: Patient is not needing meclizine and zofran. Patient is doing exercises. Did not go to physical therapy, because they indicated they were going to call him back for an appointment, but he has improved some. Not sure he still needs physical therapy.    Current Outpatient Medications on File Prior to Visit  Medication Sig Dispense Refill   Ascorbic Acid (VITAMIN C) 1000 MG tablet Take 1,000 mg by mouth daily.     aspirin EC 81 MG tablet Take 81 mg by mouth daily.     Calcium-Magnesium-Vitamin D (CALCIUM 1200+D3 PO) Take 1 tablet by mouth daily.     meclizine (ANTIVERT) 25 MG tablet Take 1 tablet (25 mg total) by mouth 3 (three) times daily as needed for dizziness. 30 tablet 0   meloxicam (MOBIC) 7.5 MG tablet TAKE 1 TABLET IN THE MORNING AND AT BEDTIME (Patient taking differently: Take 15 mg by mouth daily.) 180 tablet 0   Multiple Vitamin (MULTIVITAMIN) capsule Take 1 capsule by mouth daily.     ondansetron (ZOFRAN) 4 MG tablet Take 1 tablet (4 mg total) by mouth every 8 (eight) hours as needed for nausea or vomiting. 30 tablet 1   simvastatin (ZOCOR) 10 MG tablet TAKE 1 TABLET AT BEDTIME 90 tablet 1   valsartan-hydrochlorothiazide (DIOVAN-HCT) 160-25 MG tablet TAKE 1 TABLET EVERY DAY (Patient taking differently: Take 1 tablet by mouth 2 (two) times daily.) 90 tablet 1   No current facility-administered medications on file prior to visit.   Past Medical History:  Diagnosis Date   Arthritis    Atherosclerosis of aorta (HCC)    CAD (coronary artery disease)    Erectile dysfunction    High blood pressure    High cholesterol    Other acute pancreatitis without necrosis or infection    Prediabetes    Past Surgical  History:  Procedure Laterality Date   HERNIA REPAIR     TRANSFORAMINAL LUMBAR INTERBODY FUSION (TLIF) WITH PEDICLE SCREW FIXATION 2 LEVEL Right 05/25/2021   Procedure: RIGHT-SIDED LUMBAR 3- LUMBAR 4, LUMBAR 4- LUMBAR 5 TRANSFORAMINAL LUMBAR INTERBODY FUSION AND DECOMPRESSION WITH INSTRUMENTATION AND ALLOGRAFT;  Surgeon: Phylliss Bob, MD;  Location: [facility];  Service: Orthopedics;  Laterality: Right;    Family History  Problem Relation Age of Onset   Cancer Mother    Heart failure Father    Kidney Stones Father    Hypertension Other    Cancer Other    Social History   Socioeconomic History   Marital status: Married    Spouse name: Marlowe Kays   Number of children: 1   Years of education: Not on file   Highest education level: Not on file  Occupational History   Occupation: Engineer, site: [employer]  Tobacco Use   Smoking status: Former    Types: Cigarettes    Quit date: 1980    Years since quitting: 43.7   Smokeless tobacco: Never  Vaping Use   Vaping Use: Never used  Substance and Sexual Activity   Alcohol use: Not Currently    Alcohol/week: 2.0 standard drinks of alcohol    Types: 1 Glasses of wine, 1 Cans of beer per week   Drug use: Never  Sexual activity: Not Currently  Other Topics Concern   Not on file  Social History Narrative   Not on file   Social Determinants of Health   Financial Resource Strain: Low Risk  (09/14/2020)   Overall Financial Resource Strain (CARDIA)    Difficulty of Paying Living Expenses: Not hard at all  Food Insecurity: No Food Insecurity (10/21/2020)   Hunger Vital Sign    Worried About Running Out of Food in the Last Year: Never true    Ran Out of Food in the Last Year: Never true  Transportation Needs: No Transportation Needs (09/14/2020)   PRAPARE - Administrator, Civil ServiceTransportation    Lack of Transportation (Medical): No    Lack of Transportation (Non-Medical): No  Physical Activity: Sufficiently Active (10/21/2020)   Exercise Vital  Sign    Days of Exercise per Week: 4 days    Minutes of Exercise per Session: 40 min  Stress: Not on file  Social Connections: Not on file    Review of Systems  Constitutional:  Negative for chills and fever.  HENT:  Negative for congestion, rhinorrhea and sore throat.   Respiratory:  Negative for cough and shortness of breath.   Cardiovascular:  Negative for chest pain and palpitations.  Gastrointestinal:  Negative for abdominal pain, constipation, diarrhea, nausea and vomiting.  Genitourinary:  Negative for dysuria and urgency.  Musculoskeletal:  Negative for arthralgias, back pain and myalgias.  Neurological:  Positive for dizziness. Negative for headaches.  Psychiatric/Behavioral:  Negative for dysphoric mood. The patient is not nervous/anxious.      Objective:  BP (!) 142/78   Pulse 80   Temp (!) 97.3 F (36.3 C)   Resp 14   Ht 5' 8.5" (1.74 m)   Wt 200 lb (90.7 kg)   BMI 29.97 kg/m      09/26/2021    9:34 AM 09/06/2021    3:59 PM 08/09/2021    4:02 PM  BP/Weight  Systolic BP 142  [id number]  Diastolic BP 78  86  Wt. (Lbs) 200 196 198  BMI 29.97 kg/m2 29.37 kg/m2 29.67 kg/m2    Physical Exam Vitals reviewed.  Constitutional:      Appearance: Normal appearance.  Neck:     Vascular: No carotid bruit.  Cardiovascular:     Rate and Rhythm: Normal rate and regular rhythm.     Pulses: Normal pulses.     Heart sounds: Normal heart sounds.  Pulmonary:     Effort: Pulmonary effort is normal.     Breath sounds: Normal breath sounds. No wheezing, rhonchi or rales.  Abdominal:     General: Bowel sounds are normal.     Palpations: Abdomen is soft.     Tenderness: There is no abdominal tenderness.  Neurological:     Mental Status: He is alert.  Psychiatric:        Mood and Affect: Mood normal.        Behavior: Behavior normal.     Diabetic Foot Exam - Simple   No data filed      Lab Results  Component Value Date   WBC 6.4 08/09/2021   HGB 15.4 08/09/2021   HCT  46.0 08/09/2021   PLT 203 08/09/2021   GLUCOSE 99 08/09/2021   CHOL 136 10/21/2020   TRIG 75 10/21/2020   HDL 48 10/21/2020   LDLCALC 73 10/21/2020   ALT 36 08/09/2021   AST 32 08/09/2021   NA 142 08/09/2021   K 3.9 08/09/2021   CL 104  08/09/2021   CREATININE 0.98 08/09/2021   BUN 19 08/09/2021   CO2 26 08/09/2021   TSH 2.650 08/09/2021   HGBA1C 5.9 (H) 08/09/2021      Assessment & Plan:   Problem List Items Addressed This Visit       Cardiovascular and Mediastinum   Essential hypertension, benign - Primary    Start on amlodipine 2.5 mg daily.  Continue valsarta/hydrochlorothiazide 160/25 mg 2 daily (plan to change to valsartan/hctz 320/25 mg daily when current rx runs out.)       Relevant Medications   amLODipine (NORVASC) 2.5 MG tablet     Nervous and Auditory   BPPV (benign paroxysmal positional vertigo)    Improved. Recommend take meclizine and zofran when goes on upcoming trip.       Other Visit Diagnoses     Need for influenza vaccination       Relevant Orders   Flu Vaccine QUAD High Dose(Fluad) (Completed)     .  Meds ordered this encounter  Medications   amLODipine (NORVASC) 2.5 MG tablet    Sig: Take 1 tablet (2.5 mg total) by mouth daily.    Dispense:  90 tablet    Refill:  0    Orders Placed This Encounter  Procedures   Flu Vaccine QUAD High Dose(Fluad)     Follow-up: Return in about 4 weeks (around 10/24/2021) for awv.  An After Visit Summary was printed and given to the patient.  Rochel Brome, MD [facility] [phone]

## 2021-09-26 NOTE — Assessment & Plan Note (Addendum)
Start on amlodipine 2.5 mg daily.  Continue valsarta/hydrochlorothiazide 160/25 mg 2 daily (plan to change to valsartan/hctz 320/25 mg daily when current rx runs out.)

## 2021-09-26 NOTE — Patient Instructions (Signed)
Start on amlodipine 2.5 mg daily.  Continue valsarta/hydrochlorothiazide 160/25 mg 2 daily.

## 2021-09-26 NOTE — Assessment & Plan Note (Addendum)
Improved. Recommend take meclizine and zofran when goes on upcoming trip.

## 2021-10-24 ENCOUNTER — Ambulatory Visit (INDEPENDENT_AMBULATORY_CARE_PROVIDER_SITE_OTHER): Payer: Medicare PPO | Admitting: Family Medicine

## 2021-10-24 ENCOUNTER — Encounter: Payer: Self-pay | Admitting: Family Medicine

## 2021-10-24 VITALS — BP 124/86 | HR 68 | Temp 97.3°F | Ht 68.25 in | Wt 199.0 lb

## 2021-10-24 DIAGNOSIS — Z0000 Encounter for general adult medical examination without abnormal findings: Secondary | ICD-10-CM | POA: Insufficient documentation

## 2021-10-24 DIAGNOSIS — E782 Mixed hyperlipidemia: Secondary | ICD-10-CM

## 2021-10-24 DIAGNOSIS — Z6830 Body mass index (BMI) 30.0-30.9, adult: Secondary | ICD-10-CM | POA: Insufficient documentation

## 2021-10-24 NOTE — Patient Instructions (Addendum)
Things to do to keep yourself healthy  - Exercise at least 30-45 minutes a day, 3-4 days a week.  - Eat a low-fat diet with lots of fruits and vegetables, up to 7-9 servings per day.  - Seatbelts can save your life. Wear them always.  - Smoke detectors on every level of your home, check batteries every year.  - Eye Doctor - have an eye exam every 1-2 years  - Safe sex - if you may be exposed to STDs, use a condom.  - Alcohol -  If you drink, do it moderately, less than 2 drinks per day.  - [place]. Choose someone to speak for you if you are not able.  - Depression is common in our stressful world.If you're feeling down or losing interest in things you normally enjoy, please come in for a visit.    GET TDAP tetanus at pharmacy.   Call [facility] in [place]

## 2021-10-24 NOTE — Progress Notes (Unsigned)
Subjective:   Austin Atkins is a 76 y.o. male who presents for Medicare Annual/Subsequent preventive examination. Vertigo comes and goes with certain positions. Stands up slowly. Not affected by rolling side to side in bed. Given BPPV exercises, but not helping. No tinnitus. No headaches.  Has it daily. Lasts for seconds.  Review of Systems    Review of Systems  Constitutional:  Negative for chills, diaphoresis, fever and malaise/fatigue.  HENT:  Negative for congestion, ear pain and sore throat.   Respiratory:  Negative for cough and shortness of breath.   Cardiovascular:  Negative for chest pain and palpitations.  Gastrointestinal:  Negative for abdominal pain, constipation, diarrhea, nausea and vomiting.  Genitourinary:  Negative for dysuria and urgency.  Musculoskeletal:  Negative for back pain, falls, joint pain and myalgias.  Neurological:  Positive for dizziness. Negative for headaches.  Psychiatric/Behavioral:  Negative for depression. The patient is not nervous/anxious.     Cardiac Risk Factors include: hypertension;male gender;dyslipidemia     Objective:    Today's Vitals   10/24/21 1123  BP: 124/86  Pulse: 68  Temp: (!) 97.3 F (36.3 C)  TempSrc: Temporal  SpO2: 97%  Weight: 199 lb (90.3 kg)  Height: 5' 8.25" (1.734 m)   Body mass index is 30.04 kg/m.     10/24/2021   11:25 AM 05/18/2021   10:21 AM 10/21/2020    3:53 PM 03/10/2019    8:49 AM  Advanced Directives  Does Patient Have a Medical Advance Directive? Yes No Yes Yes  Type of Estate agent of [place];Living will  Healthcare Power of [place];Living will Healthcare Power of [place];Living will  Does patient want to make changes to medical advance directive?   No - Patient declined   Copy of Healthcare Power of Attorney in Chart?   No - copy requested   Would patient like information on creating a medical advance directive?  No - Patient declined      Current Medications  (verified) Outpatient Encounter Medications as of 10/24/2021  Medication Sig   amLODipine (NORVASC) 2.5 MG tablet Take 1 tablet (2.5 mg total) by mouth daily.   Ascorbic Acid (VITAMIN C) 1000 MG tablet Take 1,000 mg by mouth daily.   aspirin EC 81 MG tablet Take 81 mg by mouth daily.   Calcium-Magnesium-Vitamin D (CALCIUM 1200+D3 PO) Take 1 tablet by mouth daily.   meclizine (ANTIVERT) 25 MG tablet Take 1 tablet (25 mg total) by mouth 3 (three) times daily as needed for dizziness.   Multiple Vitamin (MULTIVITAMIN) capsule Take 1 capsule by mouth daily.   ondansetron (ZOFRAN) 4 MG tablet Take 1 tablet (4 mg total) by mouth every 8 (eight) hours as needed for nausea or vomiting.   simvastatin (ZOCOR) 10 MG tablet TAKE 1 TABLET AT BEDTIME   valsartan-hydrochlorothiazide (DIOVAN-HCT) 160-25 MG tablet TAKE 1 TABLET EVERY DAY (Patient taking differently: Take 1 tablet by mouth 2 (two) times daily.)   [DISCONTINUED] meloxicam (MOBIC) 7.5 MG tablet TAKE 1 TABLET IN THE MORNING AND AT BEDTIME (Patient taking differently: Take 15 mg by mouth daily.)   No facility-administered encounter medications on file as of 10/24/2021.    Allergies (verified) Patient has no known allergies.   History: Past Medical History:  Diagnosis Date   Arthritis    Atherosclerosis of aorta (HCC)    CAD (coronary artery disease)    Erectile dysfunction    High blood pressure    High cholesterol    Other acute pancreatitis  without necrosis or infection    Prediabetes    Past Surgical History:  Procedure Laterality Date   HERNIA REPAIR     TRANSFORAMINAL LUMBAR INTERBODY FUSION (TLIF) WITH PEDICLE SCREW FIXATION 2 LEVEL Right 05/25/2021   Procedure: RIGHT-SIDED LUMBAR 3- LUMBAR 4, LUMBAR 4- LUMBAR 5 TRANSFORAMINAL LUMBAR INTERBODY FUSION AND DECOMPRESSION WITH INSTRUMENTATION AND ALLOGRAFT;  Surgeon: Estill Bamberg, MD;  Location: [facility];  Service: Orthopedics;  Laterality: Right;   Family History  Problem Relation  Age of Onset   Cancer Mother    Heart failure Father    Kidney Stones Father    Hypertension Other    Cancer Other    Social History   Socioeconomic History   Marital status: Married    Spouse name: Junious Dresser   Number of children: 1   Years of education: Not on file   Highest education level: Not on file  Occupational History   Occupation: Magazine features editor: [employer]  Tobacco Use   Smoking status: Former    Types: Cigarettes    Quit date: 1980    Years since quitting: 43.8   Smokeless tobacco: Never  Vaping Use   Vaping Use: Never used  Substance and Sexual Activity   Alcohol use: Not Currently    Alcohol/week: 2.0 standard drinks of alcohol    Types: 1 Glasses of wine, 1 Cans of beer per week   Drug use: Never   Sexual activity: Not Currently  Other Topics Concern   Not on file  Social History Narrative   Not on file   Social Determinants of Health   Financial Resource Strain: Low Risk  (10/24/2021)   Overall Financial Resource Strain (CARDIA)    Difficulty of Paying Living Expenses: Not hard at all  Food Insecurity: No Food Insecurity (10/24/2021)   Hunger Vital Sign    Worried About Running Out of Food in the Last Year: Never true    Ran Out of Food in the Last Year: Never true  Transportation Needs: No Transportation Needs (10/24/2021)   PRAPARE - Administrator, Civil Service (Medical): No    Lack of Transportation (Non-Medical): No  Physical Activity: Insufficiently Active (10/24/2021)   Exercise Vital Sign    Days of Exercise per Week: 4 days    Minutes of Exercise per Session: 30 min  Stress: No Stress Concern Present (10/24/2021)   [employer] of Occupational Health - Occupational Stress Questionnaire    Feeling of Stress : Not at all  Social Connections: Moderately Isolated (10/24/2021)   Social Connection and Isolation Panel [NHANES]    Frequency of Communication with Friends and Family: More than three times a week     Frequency of Social Gatherings with Friends and Family: More than three times a week    Attends Religious Services: Never    Database administrator or Organizations: No    Attends Banker Meetings: Never    Marital Status: Married    Tobacco Counseling Counseling given: Not Answered        Activities of Daily Living    10/24/2021   11:25 AM 05/18/2021   10:23 AM  In your present state of health, do you have any difficulty performing the following activities:  Hearing? 0   Vision? 0   Difficulty concentrating or making decisions? 0   Walking or climbing stairs? 0   Dressing or bathing? 0   Doing errands, shopping? 0 0  Preparing Food and  eating ? N   Using the Toilet? N   In the past six months, have you accidently leaked urine? N   Do you have problems with loss of bowel control? N   Managing your Medications? N   Managing your Finances? N   Housekeeping or managing your Housekeeping? N     Patient Care Team: Blane Ohara, MD as PCP - General (Family Medicine) Earvin Hansen, [facility] (Inactive) as Pharmacist (Pharmacist)  Indicate any recent Medical Services you may have received from other than [facility] providers in the past year (date may be approximate).     Assessment:   This is a routine wellness examination for Austin Atkins.  Hearing/Vision screen No results found.  Dietary issues and exercise activities discussed: Current Exercise Habits: Home exercise routine, Type of exercise: walking, Time (Minutes): 30, Frequency (Times/Week): 4, Weekly Exercise (Minutes/Week): 120, Intensity: Moderate, Exercise limited by: None identified   Goals Addressed   None   Depression Screen    10/24/2021   11:17 AM 08/09/2021    4:10 PM 10/21/2020    3:52 PM 03/10/2019    9:34 AM  PHQ 2/9 Scores  PHQ - 2 Score 0 0 0 0    Fall Risk    10/24/2021   11:18 AM 08/09/2021    4:10 PM 10/21/2020    3:53 PM 03/10/2019    9:33 AM  Fall Risk   Falls in the past year? 0 0 0 1   Number falls in past yr: 0 0 0 0  Injury with Fall? 0 0 0 0  Risk for fall due to : No Fall Risks No Fall Risks No Fall Risks   Follow up Falls evaluation completed Falls evaluation completed Falls evaluation completed Falls prevention discussed    FALL RISK PREVENTION PERTAINING TO THE HOME:  Any stairs in or around the home? Yes  If so, are there any without handrails? Yes  Home free of loose throw rugs in walkways, pet beds, electrical cords, etc? Yes  Adequate lighting in your home to reduce risk of falls? Yes   ASSISTIVE DEVICES UTILIZED TO PREVENT FALLS:  Life alert? No  Use of a cane, walker or w/c? No  Grab bars in the bathroom? Yes  Shower chair or bench in shower? Yes  Elevated toilet seat or a handicapped toilet? Yes   TIMED UP AND GO:  Was the test performed? {YES/NO:21197}.  Length of time to ambulate 10 feet: *** sec.   Gait steady and fast without use of assistive device  Cognitive Function:        10/21/2020    3:55 PM  6CIT Screen  What Year? 0 points  What month? 0 points  What time? 0 points  Count back from 20 0 points  Months in reverse 0 points  Repeat phrase 0 points  Total Score 0 points    Immunizations Immunization History  Administered Date(s) Administered   COVID-19, mRNA, vaccine(Comirnaty)12 years and older 09/26/2021   Fluad Quad(high Dose 65+) 09/11/2018, 10/06/2019, 10/21/2020, 09/26/2021   Influenza-Unspecified 10/02/2017   Moderna Covid-19 Vaccine Bivalent Booster 50yrs & up 12/15/2020   Moderna SARS-COV2 Booster Vaccination 11/04/2019, 07/13/2020   Moderna Sars-Covid-2 Vaccination 01/23/2019, 02/26/2019   Pneumococcal Conjugate-13 08/15/2016   Pneumococcal Polysaccharide-23 12/07/2010, 10/21/2020   Td 12/07/2010   Zoster Recombinat (Shingrix) 09/11/2018, 11/20/2018    {TDAP status:2101805}  {Flu Vaccine status:2101806}  {Pneumococcal vaccine status:2101807}  {Covid-19 vaccine status:2101808}  Qualifies for  Shingles Vaccine? {YES/NO:21197}  Zostavax completed {  YES/NO:21197}  {Shingrix Completed?:2101804}  Screening Tests Health Maintenance  Topic Date Due   TETANUS/TDAP  12/06/2020   COVID-19 Vaccine (4 - Moderna series) 04/15/2021   COLONOSCOPY (Pts 45-59yrs Insurance coverage will need to be confirmed)  05/18/2031   Pneumonia Vaccine 44+ Years old  Completed   INFLUENZA VACCINE  Completed   Hepatitis C Screening  Completed   Zoster Vaccines- Shingrix  Completed   HPV VACCINES  Aged Out    Health Maintenance  Health Maintenance Due  Topic Date Due   TETANUS/TDAP  12/06/2020   COVID-19 Vaccine (4 - Moderna series) 04/15/2021    {Colorectal cancer screening:2101809}  Lung Cancer Screening: (Low Dose CT Chest recommended if Age 19-80 years, 30 pack-year currently smoking OR have quit w/in 15years.) {DOES NOT does:27190::"does not"} qualify.   Lung Cancer Screening Referral: ***  Additional Screening:  Hepatitis C Screening: {DOES NOT does:27190::"does not"} qualify; Completed ***  Vision Screening: Recommended annual ophthalmology exams for early detection of glaucoma and other disorders of the eye. Is the patient up to date with their annual eye exam?  {YES/NO:21197} Who is the provider or what is the name of the office in which the patient attends annual eye exams? *** If pt is not established with a provider, would they like to be referred to a provider to establish care? {YES/NO:21197}.   Dental Screening: Recommended annual dental exams for proper oral hygiene  Community Resource Referral / Chronic Care Management: CRR required this visit?  {YES/NO:21197}  CCM required this visit?  {YES/NO:21197}     Plan:     I have personally reviewed and noted the following in the patient's chart:   Medical and social history Use of alcohol, tobacco or illicit drugs  Current medications and supplements including opioid prescriptions. {Opioid  Prescriptions:[phone]} Functional ability and status Nutritional status Physical activity Advanced directives List of other physicians Hospitalizations, surgeries, and ER visits in previous 12 months Vitals Screenings to include cognitive, depression, and falls Referrals and appointments  In addition, I have reviewed and discussed with patient certain preventive protocols, quality metrics, and best practice recommendations. A written personalized care plan for preventive services as well as general preventive health recommendations were provided to patient.     Rochel Brome, MD   10/24/2021   Nurse Notes: Geralynn Ochs I Leal-Borjas,acting as a scribe for Rochel Brome, MD.,have documented all relevant documentation on the behalf of Rochel Brome, MD,as directed by  Rochel Brome, MD while in the presence of Rochel Brome, MD.

## 2021-10-25 LAB — LIPID PANEL
Chol/HDL Ratio: 3 ratio (ref 0.0–5.0)
Cholesterol, Total: 142 mg/dL (ref 100–199)
HDL: 48 mg/dL (ref >39)
LDL Chol Calc (NIH): 77 mg/dL (ref 0–99)
Triglycerides: 90 mg/dL (ref 0–149)
VLDL Cholesterol Cal: 17 mg/dL (ref 5–40)

## 2021-10-25 LAB — CARDIOVASCULAR RISK ASSESSMENT

## 2021-10-29 NOTE — Assessment & Plan Note (Signed)
Things to do to keep yourself healthy  - Exercise at least 30-45 minutes a day, 3-4 days a week.  - Eat a low-fat diet with lots of fruits and vegetables, up to 7-9 servings per day.  - Seatbelts can save your life. Wear them always.  - Smoke detectors on every level of your home, check batteries every year.  - Eye Doctor - have an eye exam every 1-2 years  - Safe sex - if you may be exposed to STDs, use a condom.  - Alcohol -  If you drink, do it moderately, less than 2 drinks per day.  - [place]. Choose someone to speak for you if you are not able.  - Depression is common in our stressful world.If you're feeling down or losing interest in things you normally enjoy, please come in for a visit.    GET TDAP tetanus at pharmacy.

## 2021-10-29 NOTE — Assessment & Plan Note (Signed)
Well controlled.  No changes to medicines. Simvastatin 10 mg daily. Continue to work on eating a healthy diet and exercise.  Labs drawn today.

## 2021-10-29 NOTE — Assessment & Plan Note (Signed)
Recommend continue to work on eating healthy diet and exercise.  

## 2021-10-31 ENCOUNTER — Encounter: Payer: Self-pay | Admitting: Family Medicine

## 2021-11-07 DIAGNOSIS — M545 Low back pain, unspecified: Secondary | ICD-10-CM | POA: Diagnosis not present

## 2021-11-09 DIAGNOSIS — H8111 Benign paroxysmal vertigo, right ear: Secondary | ICD-10-CM | POA: Diagnosis not present

## 2021-11-10 ENCOUNTER — Other Ambulatory Visit: Payer: Self-pay

## 2021-11-10 DIAGNOSIS — I10 Essential (primary) hypertension: Secondary | ICD-10-CM

## 2021-11-10 MED ORDER — AMLODIPINE BESYLATE 2.5 MG PO TABS: 2.5000 mg | ORAL_TABLET | Freq: Every day | ORAL | 0 refills | Status: DC

## 2021-12-22 ENCOUNTER — Other Ambulatory Visit: Payer: Self-pay | Admitting: Family Medicine

## 2021-12-22 DIAGNOSIS — I10 Essential (primary) hypertension: Secondary | ICD-10-CM

## 2022-01-28 ENCOUNTER — Other Ambulatory Visit: Payer: Self-pay | Admitting: Family Medicine

## 2022-01-31 ENCOUNTER — Other Ambulatory Visit: Payer: Self-pay | Admitting: Family Medicine

## 2022-01-31 DIAGNOSIS — I10 Essential (primary) hypertension: Secondary | ICD-10-CM

## 2022-02-16 ENCOUNTER — Other Ambulatory Visit: Payer: Self-pay

## 2022-02-16 MED ORDER — VALSARTAN-HYDROCHLOROTHIAZIDE 320-25 MG PO TABS: 1.0000 | ORAL_TABLET | Freq: Every day | ORAL | 3 refills | Status: DC

## 2022-02-24 DIAGNOSIS — H8112 Benign paroxysmal vertigo, left ear: Secondary | ICD-10-CM | POA: Diagnosis not present

## 2022-03-23 ENCOUNTER — Telehealth: Payer: Self-pay

## 2022-03-23 DIAGNOSIS — J019 Acute sinusitis, unspecified: Secondary | ICD-10-CM | POA: Diagnosis not present

## 2022-03-23 DIAGNOSIS — R051 Acute cough: Secondary | ICD-10-CM | POA: Diagnosis not present

## 2022-03-23 DIAGNOSIS — R0981 Nasal congestion: Secondary | ICD-10-CM | POA: Diagnosis not present

## 2022-03-23 NOTE — Telephone Encounter (Signed)
Patient called requesting an appointment for sinus drainage, ST, and some coughing. Patient notified that we do not have a opening today or tomorrow. Patient notified that he can go to [facility] to be seen or do an e-visit with a [facility] provider on mychart.

## 2022-04-12 ENCOUNTER — Other Ambulatory Visit: Payer: Self-pay | Admitting: Family Medicine

## 2022-05-01 NOTE — Assessment & Plan Note (Addendum)
Hemoglobin A1c 5.9%, 3 month avg of blood sugars, is in prediabetic range.  In order to prevent progression to diabetes, recommend low carb diet and regular exercise  

## 2022-05-01 NOTE — Assessment & Plan Note (Addendum)
At goal. Continue amlodipine 2.5 mg daily, valsarta/hydrochlorothiazide 320-25 mg daily

## 2022-05-01 NOTE — Progress Notes (Signed)
Subjective:  Patient ID: Austin Atkins, male    DOB: 08/04/1945  Age: 77 y.o. MRN: [id number]  Chief Complaint  Patient presents with   Hyperlipidemia   Hypertension    HPI Hypertension: Taking amlodipine 2.5 mg daily, Valsartan-HCTZ 320-25 mg daily.  Hyperllpidemia with atherosclerosis: Currently on simvastatin 10 mg daily and aspirin 81 mg daily.  Prediabetic: Last A1C 5.9, eating healthy.  Eating healthy and exercises.      05/02/2022    8:28 AM 10/24/2021   11:17 AM 08/09/2021    4:10 PM 10/21/2020    3:52 PM 03/10/2019    9:34 AM  Depression screen PHQ 2/9  Decreased Interest 0 0 0 0 0  Down, Depressed, Hopeless 0 0 0 0 0  PHQ - 2 Score 0 0 0 0 0         05/25/2021    3:02 PM 05/25/2021    8:05 PM 08/09/2021    4:10 PM 10/24/2021   11:18 AM 05/02/2022    8:28 AM  Fall Risk  Falls in the past year?   0 0 0  Was there an injury with Fall?   0 0 0  Fall Risk Category Calculator   0 0 0  Fall Risk Category (Retired)   Low Low   (RETIRED) Patient Fall Risk Level Moderate fall risk Moderate fall risk Low fall risk Low fall risk   Patient at Risk for Falls Due to   No Fall Risks No Fall Risks No Fall Risks  Fall risk Follow up   Falls evaluation completed Falls evaluation completed Falls evaluation completed      Review of Systems  Constitutional:  Negative for chills, diaphoresis, fatigue and fever.  HENT:  Negative for congestion, ear pain and sore throat.   Respiratory:  Negative for cough and shortness of breath.   Cardiovascular:  Negative for chest pain and leg swelling.  Gastrointestinal:  Negative for abdominal pain, constipation, diarrhea, nausea and vomiting.  Genitourinary:  Negative for dysuria and urgency.  Musculoskeletal:  Negative for arthralgias and myalgias.  Neurological:  Negative for dizziness and headaches.  Psychiatric/Behavioral:  Negative for dysphoric mood.     Current Outpatient Medications on File Prior to Visit  Medication Sig  Dispense Refill   amLODipine (NORVASC) 2.5 MG tablet TAKE 1 TABLET (2.5 MG TOTAL) BY MOUTH DAILY. 90 tablet 3   Ascorbic Acid (VITAMIN C) 1000 MG tablet Take 1,000 mg by mouth daily.     aspirin EC 81 MG tablet Take 81 mg by mouth daily.     Calcium-Magnesium-Vitamin D (CALCIUM 1200+D3 PO) Take 1 tablet by mouth daily.     meclizine (ANTIVERT) 25 MG tablet Take 1 tablet (25 mg total) by mouth 3 (three) times daily as needed for dizziness. 30 tablet 0   Multiple Vitamin (MULTIVITAMIN) capsule Take 1 capsule by mouth daily.     ondansetron (ZOFRAN) 4 MG tablet Take 1 tablet (4 mg total) by mouth every 8 (eight) hours as needed for nausea or vomiting. 30 tablet 1   simvastatin (ZOCOR) 10 MG tablet TAKE 1 TABLET AT BEDTIME 90 tablet 1   valsartan-hydrochlorothiazide (DIOVAN-HCT) 320-25 MG tablet Take 1 tablet by mouth daily. 90 tablet 3   No current facility-administered medications on file prior to visit.   Past Medical History:  Diagnosis Date   Arthritis    Atherosclerosis of aorta (HCC)    CAD (coronary artery disease)    Erectile dysfunction    High blood  pressure    High cholesterol    Other acute pancreatitis without necrosis or infection    Prediabetes    Past Surgical History:  Procedure Laterality Date   HERNIA REPAIR     TRANSFORAMINAL LUMBAR INTERBODY FUSION (TLIF) WITH PEDICLE SCREW FIXATION 2 LEVEL Right 05/25/2021   Procedure: RIGHT-SIDED LUMBAR 3- LUMBAR 4, LUMBAR 4- LUMBAR 5 TRANSFORAMINAL LUMBAR INTERBODY FUSION AND DECOMPRESSION WITH INSTRUMENTATION AND ALLOGRAFT;  Surgeon: Estill Bamberg, MD;  Location: [facility] OR;  Service: Orthopedics;  Laterality: Right;    Family History  Problem Relation Age of Onset   Cancer Mother    Heart failure Father    Kidney Stones Father    Hypertension Other    Cancer Other    Social History   Socioeconomic History   Marital status: Married    Spouse name: Junious Dresser   Number of children: 1   Years of education: Not on file   Highest  education level: Not on file  Occupational History   Occupation: Magazine features editor: [employer]  Tobacco Use   Smoking status: Former    Types: Cigarettes    Quit date: 1980    Years since quitting: 44.3   Smokeless tobacco: Never  Vaping Use   Vaping Use: Never used  Substance and Sexual Activity   Alcohol use: Not Currently    Alcohol/week: 2.0 standard drinks of alcohol    Types: 1 Glasses of wine, 1 Cans of beer per week   Drug use: Never   Sexual activity: Not Currently  Other Topics Concern   Not on file  Social History Narrative   Not on file   Social Determinants of Health   Financial Resource Strain: Low Risk  (10/24/2021)   Overall Financial Resource Strain (CARDIA)    Difficulty of Paying Living Expenses: Not hard at all  Food Insecurity: No Food Insecurity (10/24/2021)   Hunger Vital Sign    Worried About Running Out of Food in the Last Year: Never true    Ran Out of Food in the Last Year: Never true  Transportation Needs: No Transportation Needs (10/24/2021)   PRAPARE - Administrator, Civil Service (Medical): No    Lack of Transportation (Non-Medical): No  Physical Activity: Insufficiently Active (10/24/2021)   Exercise Vital Sign    Days of Exercise per Week: 4 days    Minutes of Exercise per Session: 30 min  Stress: No Stress Concern Present (10/24/2021)   [employer] of Occupational Health - Occupational Stress Questionnaire    Feeling of Stress : Not at all  Social Connections: Moderately Isolated (10/24/2021)   Social Connection and Isolation Panel [NHANES]    Frequency of Communication with Friends and Family: More than three times a week    Frequency of Social Gatherings with Friends and Family: More than three times a week    Attends Religious Services: Never    Database administrator or Organizations: No    Attends Engineer, structural: Never    Marital Status: Married    Objective:  BP 126/74    Pulse 74   Temp 98.4 F (36.9 C)   Ht 5\' 8"  (1.727 m)   Wt 193 lb (87.5 kg)   SpO2 98%   BMI 29.35 kg/m      05/02/2022    8:27 AM 10/24/2021   11:23 AM 09/26/2021    9:34 AM  BP/Weight  Systolic BP 126 124 142  Diastolic BP 74  86 78  Wt. (Lbs) 193 199 200  BMI 29.35 kg/m2 30.04 kg/m2 29.97 kg/m2    Physical Exam Vitals reviewed.  Constitutional:      Appearance: Normal appearance. He is normal weight.  Cardiovascular:     Rate and Rhythm: Normal rate and regular rhythm.     Heart sounds: No murmur heard. Pulmonary:     Effort: Pulmonary effort is normal.     Breath sounds: Normal breath sounds.  Abdominal:     General: Abdomen is flat. Bowel sounds are normal.     Palpations: Abdomen is soft.     Tenderness: There is no abdominal tenderness.     Hernia: A hernia is present.  Neurological:     Mental Status: He is alert and oriented to person, place, and time.  Psychiatric:        Mood and Affect: Mood normal.        Behavior: Behavior normal.     Diabetic Foot Exam - Simple   No data filed      Lab Results  Component Value Date   WBC 5.9 05/02/2022   HGB 15.1 05/02/2022   HCT 45.0 05/02/2022   PLT 209 05/02/2022   GLUCOSE 105 (H) 05/02/2022   CHOL 143 05/02/2022   TRIG 76 05/02/2022   HDL 49 05/02/2022   LDLCALC 79 05/02/2022   ALT 33 05/02/2022   AST 25 05/02/2022   NA 144 05/02/2022   K 4.5 05/02/2022   CL 104 05/02/2022   CREATININE 0.78 05/02/2022   BUN 14 05/02/2022   CO2 25 05/02/2022   TSH 2.650 08/09/2021   HGBA1C 6.3 (H) 05/02/2022      Assessment & Plan:    Essential hypertension, benign Assessment & Plan: At goal. Continue amlodipine 2.5 mg daily, valsarta/hydrochlorothiazide 320-25 mg daily  Orders: -     Comprehensive metabolic panel  Impaired fasting glucose Assessment & Plan: Hemoglobin A1c 5.9%, 3 month avg of blood sugars, is in prediabetic range.  In order to prevent progression to diabetes, recommend low carb diet  and regular exercise   Orders: -     Hemoglobin A1c -     CBC with Differential/Platelet  Benign prostatic hyperplasia with lower urinary tract symptoms, symptom details unspecified Assessment & Plan: Recommend try saw palmetto.  Orders: -     PSA  Mixed hyperlipidemia Assessment & Plan: Well controlled.  No changes to medicines. Simvastatin 10 mg daily. Continue to work on eating a healthy diet and exercise.  Labs drawn today.   Orders: -     Lipid panel  Atherosclerosis of aorta (HCC) Assessment & Plan: Continue simvastatin 10 mg one before bed and aspirin 81 mg daily.    Other orders -     Cardiovascular Risk Assessment     No orders of the defined types were placed in this encounter.   Orders Placed This Encounter  Procedures   Comprehensive metabolic panel   Hemoglobin A1c   Lipid panel   CBC with Differential/Platelet   PSA   Cardiovascular Risk Assessment     Follow-up: Return in about 6 months (around 11/01/2022) for chronic, fasting, awv.   I,Marla I Leal-Borjas,acting as a scribe for Blane Ohara, MD.,have documented all relevant documentation on the behalf of Blane Ohara, MD,as directed by  Blane Ohara, MD while in the presence of Blane Ohara, MD.   An After Visit Summary was printed and given to the patient.  I attest that I have reviewed this  visit and agree with the plan scribed by my staff.   Blane Ohara, MD [facility] [phone]

## 2022-05-01 NOTE — Assessment & Plan Note (Addendum)
Recommend try saw palmetto.

## 2022-05-01 NOTE — Assessment & Plan Note (Signed)
Well controlled.  No changes to medicines. Simvastatin 10 mg daily. Continue to work on eating a healthy diet and exercise.  Labs drawn today.  

## 2022-05-02 ENCOUNTER — Encounter: Payer: Self-pay | Admitting: Family Medicine

## 2022-05-02 ENCOUNTER — Ambulatory Visit: Payer: Medicare PPO | Admitting: Family Medicine

## 2022-05-02 VITALS — BP 126/74 | HR 74 | Temp 98.4°F | Ht 68.0 in | Wt 193.0 lb

## 2022-05-02 DIAGNOSIS — I10 Essential (primary) hypertension: Secondary | ICD-10-CM | POA: Diagnosis not present

## 2022-05-02 DIAGNOSIS — E782 Mixed hyperlipidemia: Secondary | ICD-10-CM | POA: Diagnosis not present

## 2022-05-02 DIAGNOSIS — R7301 Impaired fasting glucose: Secondary | ICD-10-CM | POA: Diagnosis not present

## 2022-05-02 DIAGNOSIS — N401 Enlarged prostate with lower urinary tract symptoms: Secondary | ICD-10-CM

## 2022-05-02 DIAGNOSIS — I700 Atherosclerosis of aorta: Secondary | ICD-10-CM

## 2022-05-02 NOTE — Patient Instructions (Addendum)
Discuss with Pharmacist about RSV vaccine.

## 2022-05-03 LAB — COMPREHENSIVE METABOLIC PANEL
ALT: 33 IU/L (ref 0–44)
AST: 25 IU/L (ref 0–40)
Albumin/Globulin Ratio: 2.2 (ref 1.2–2.2)
Albumin: 4.4 g/dL (ref 3.8–4.8)
Alkaline Phosphatase: 107 IU/L (ref 44–121)
BUN/Creatinine Ratio: 18 (ref 10–24)
BUN: 14 mg/dL (ref 8–27)
Bilirubin Total: 0.7 mg/dL (ref 0.0–1.2)
CO2: 25 mmol/L (ref 20–29)
Calcium: 9.5 mg/dL (ref 8.6–10.2)
Chloride: 104 mmol/L (ref 96–106)
Creatinine, Ser: 0.78 mg/dL (ref 0.76–1.27)
Globulin, Total: 2 g/dL (ref 1.5–4.5)
Glucose: 105 mg/dL — ABNORMAL HIGH (ref 70–99)
Potassium: 4.5 mmol/L (ref 3.5–5.2)
Sodium: 144 mmol/L (ref 134–144)
Total Protein: 6.4 g/dL (ref 6.0–8.5)
eGFR: 94 mL/min/{1.73_m2} (ref >59)

## 2022-05-03 LAB — HEMOGLOBIN A1C
Est. average glucose Bld gHb Est-mCnc: 134 mg/dL
Hgb A1c MFr Bld: 6.3 % — ABNORMAL HIGH (ref 4.8–5.6)

## 2022-05-03 LAB — LIPID PANEL
Chol/HDL Ratio: 2.9 ratio (ref 0.0–5.0)
Cholesterol, Total: 143 mg/dL (ref 100–199)
HDL: 49 mg/dL (ref >39)
LDL Chol Calc (NIH): 79 mg/dL (ref 0–99)
Triglycerides: 76 mg/dL (ref 0–149)
VLDL Cholesterol Cal: 15 mg/dL (ref 5–40)

## 2022-05-03 LAB — CBC WITH DIFFERENTIAL/PLATELET
Basophils Absolute: 0 10*3/uL (ref 0.0–0.2)
Basos: 0 %
EOS (ABSOLUTE): 0.1 10*3/uL (ref 0.0–0.4)
Eos: 1 %
Hematocrit: 45 % (ref 37.5–51.0)
Hemoglobin: 15.1 g/dL (ref 13.0–17.7)
Immature Grans (Abs): 0 10*3/uL (ref 0.0–0.1)
Immature Granulocytes: 0 %
Lymphocytes Absolute: 1.4 10*3/uL (ref 0.7–3.1)
Lymphs: 24 %
MCH: 30.5 pg (ref 26.6–33.0)
MCHC: 33.6 g/dL (ref 31.5–35.7)
MCV: 91 fL (ref 79–97)
Monocytes Absolute: 0.4 10*3/uL (ref 0.1–0.9)
Monocytes: 7 %
Neutrophils Absolute: 4 10*3/uL (ref 1.4–7.0)
Neutrophils: 68 %
Platelets: 209 10*3/uL (ref 150–450)
RBC: 4.95 x10E6/uL (ref 4.14–5.80)
RDW: 13.3 % (ref 11.6–15.4)
WBC: 5.9 10*3/uL (ref 3.4–10.8)

## 2022-05-03 LAB — PSA: Prostate Specific Ag, Serum: 2.8 ng/mL (ref 0.0–4.0)

## 2022-05-03 LAB — CARDIOVASCULAR RISK ASSESSMENT

## 2022-05-07 NOTE — Assessment & Plan Note (Signed)
Continue simvastatin 10 mg one before bed and aspirin 81 mg daily.

## 2022-06-01 DIAGNOSIS — M4800 Spinal stenosis, site unspecified: Secondary | ICD-10-CM | POA: Diagnosis not present

## 2022-06-01 DIAGNOSIS — N529 Male erectile dysfunction, unspecified: Secondary | ICD-10-CM | POA: Diagnosis not present

## 2022-06-01 DIAGNOSIS — I10 Essential (primary) hypertension: Secondary | ICD-10-CM | POA: Diagnosis not present

## 2022-06-01 DIAGNOSIS — M5430 Sciatica, unspecified side: Secondary | ICD-10-CM | POA: Diagnosis not present

## 2022-06-01 DIAGNOSIS — M1990 Unspecified osteoarthritis, unspecified site: Secondary | ICD-10-CM | POA: Diagnosis not present

## 2022-06-01 DIAGNOSIS — Z87891 Personal history of nicotine dependence: Secondary | ICD-10-CM | POA: Diagnosis not present

## 2022-06-01 DIAGNOSIS — E785 Hyperlipidemia, unspecified: Secondary | ICD-10-CM | POA: Diagnosis not present

## 2022-06-13 DIAGNOSIS — J324 Chronic pansinusitis: Secondary | ICD-10-CM | POA: Diagnosis not present

## 2022-06-23 DIAGNOSIS — J324 Chronic pansinusitis: Secondary | ICD-10-CM | POA: Diagnosis not present

## 2022-06-23 DIAGNOSIS — R059 Cough, unspecified: Secondary | ICD-10-CM | POA: Diagnosis not present

## 2022-06-23 DIAGNOSIS — R0981 Nasal congestion: Secondary | ICD-10-CM | POA: Diagnosis not present

## 2022-07-19 DIAGNOSIS — R519 Headache, unspecified: Secondary | ICD-10-CM | POA: Diagnosis not present

## 2022-07-19 DIAGNOSIS — R059 Cough, unspecified: Secondary | ICD-10-CM | POA: Diagnosis not present

## 2022-07-19 DIAGNOSIS — R509 Fever, unspecified: Secondary | ICD-10-CM | POA: Diagnosis not present

## 2022-08-06 IMAGING — DX DG LUMBAR SPINE COMPLETE 4+V
5 series · 5 of 5 positions shown · non-contrast
Comparison: CT abdomen and pelvis and reconstructions 11/08/2018.

CLINICAL DATA: Back pain, spinal stenosis suspected.

EXAM:
LUMBAR SPINE - COMPLETE 4+ VIEW

[l-spine ap]
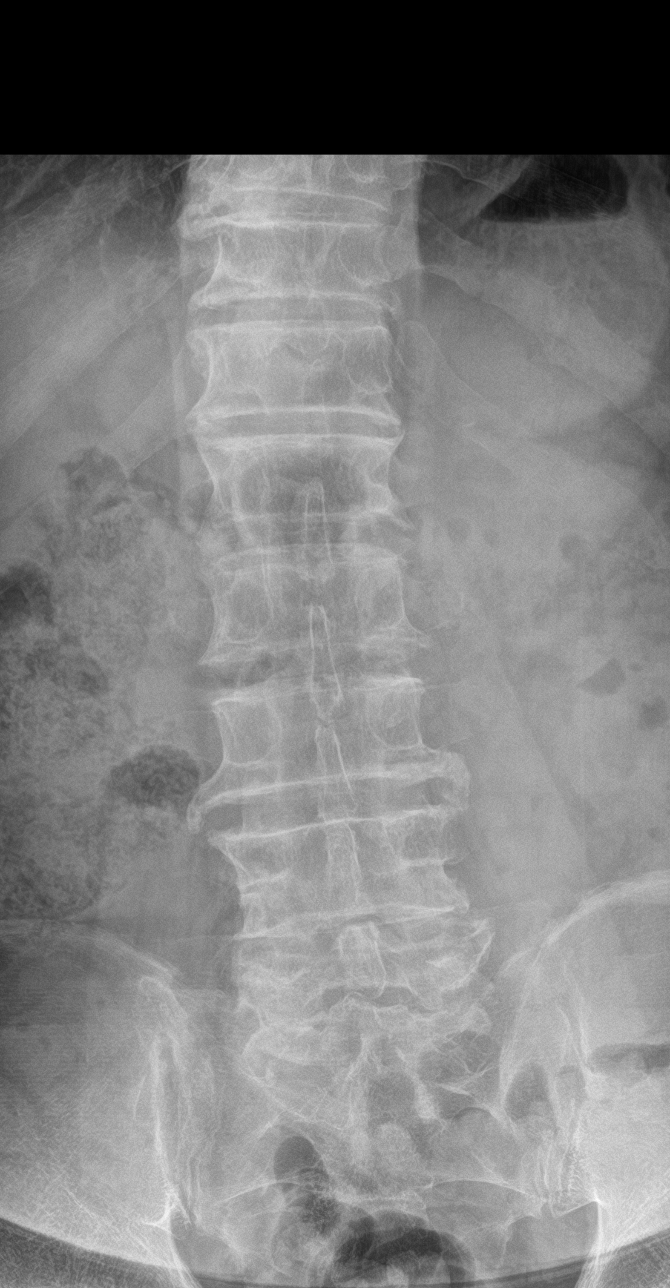

[l-spine obl (1 of 2)]
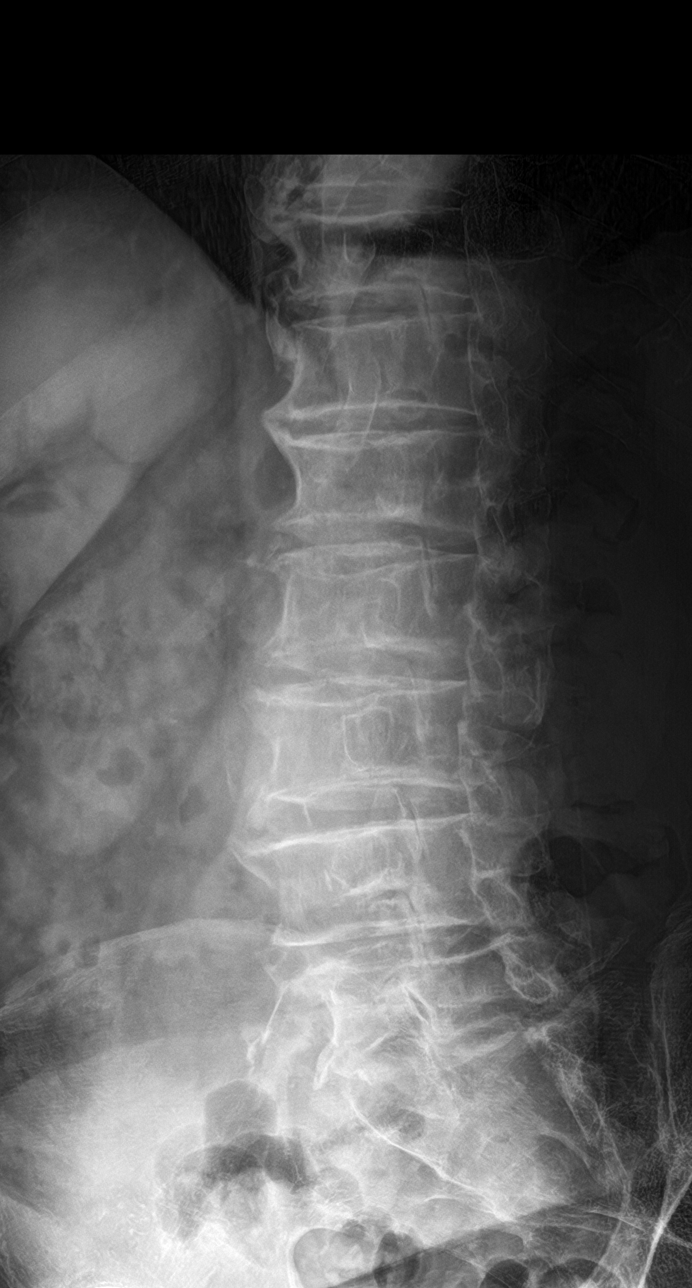

[l-spine obl (2 of 2)]
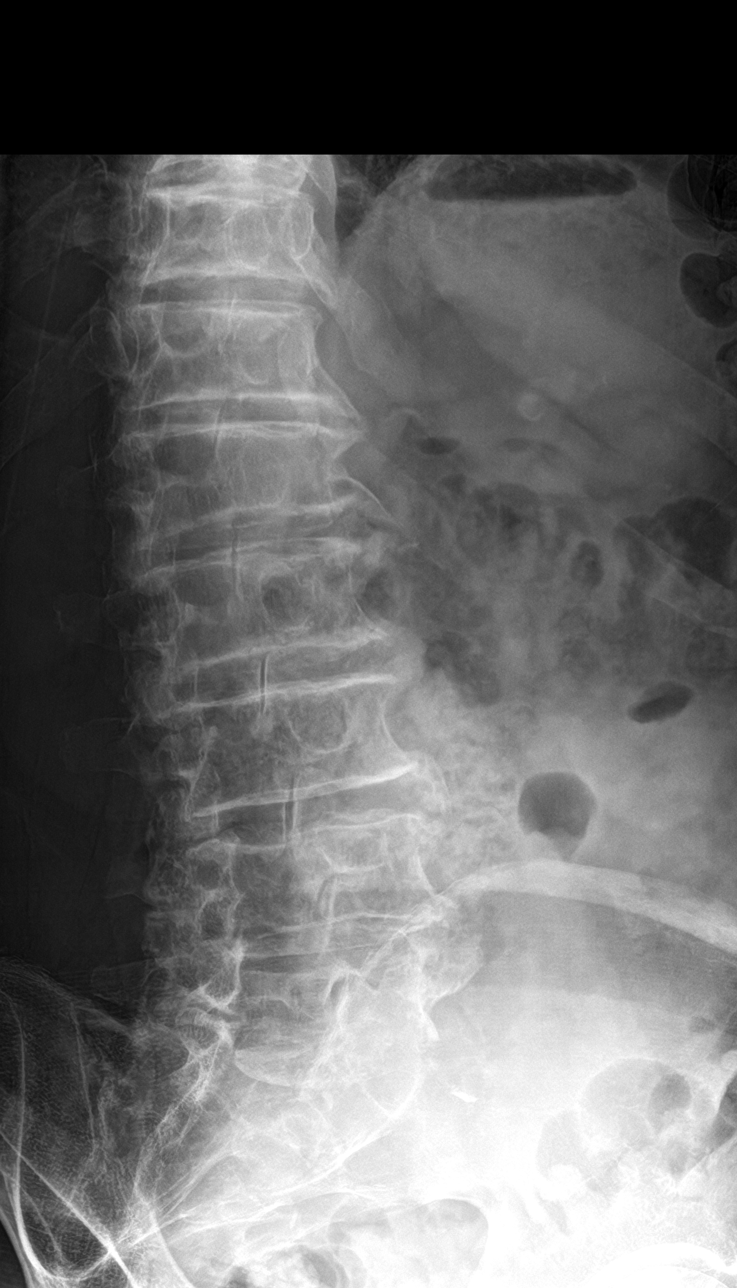

[l-spine lat]
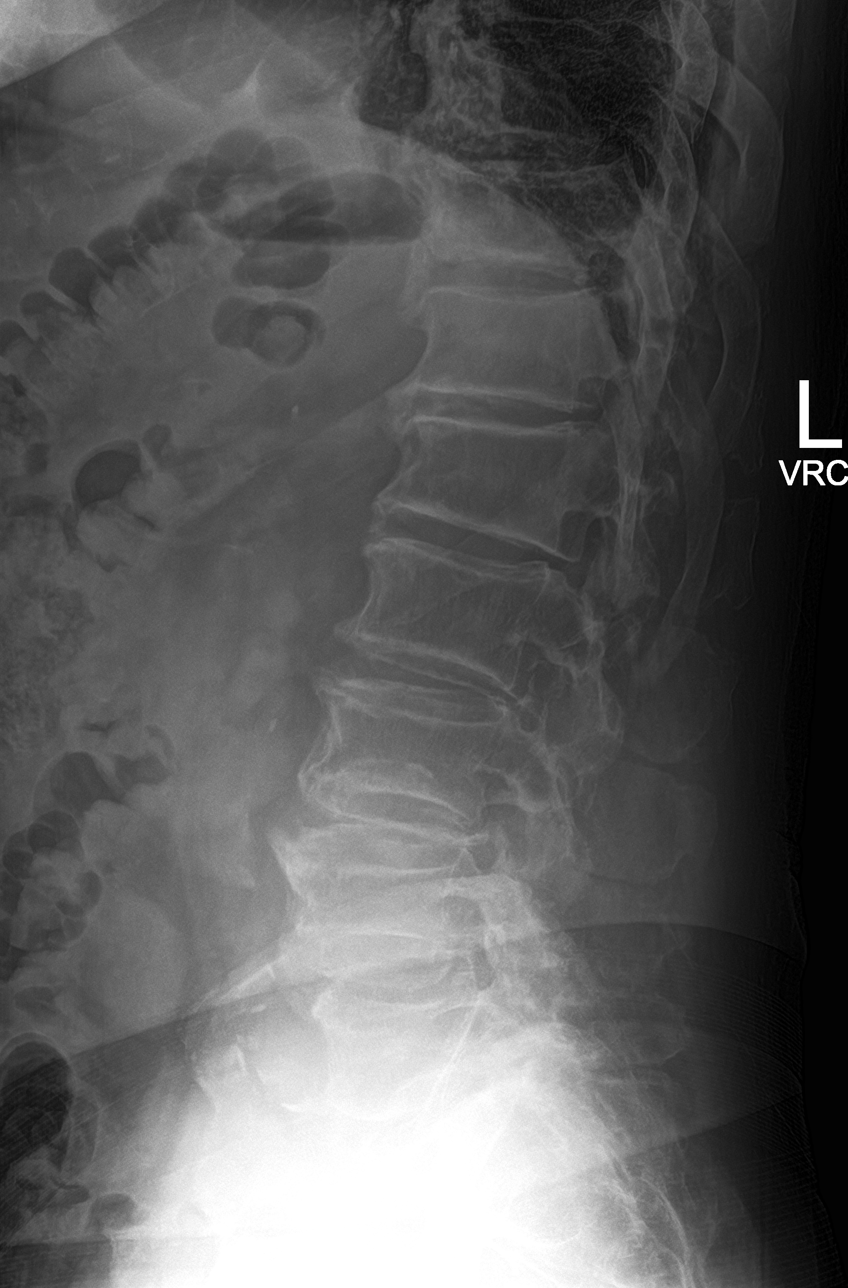

[l-spine spot]
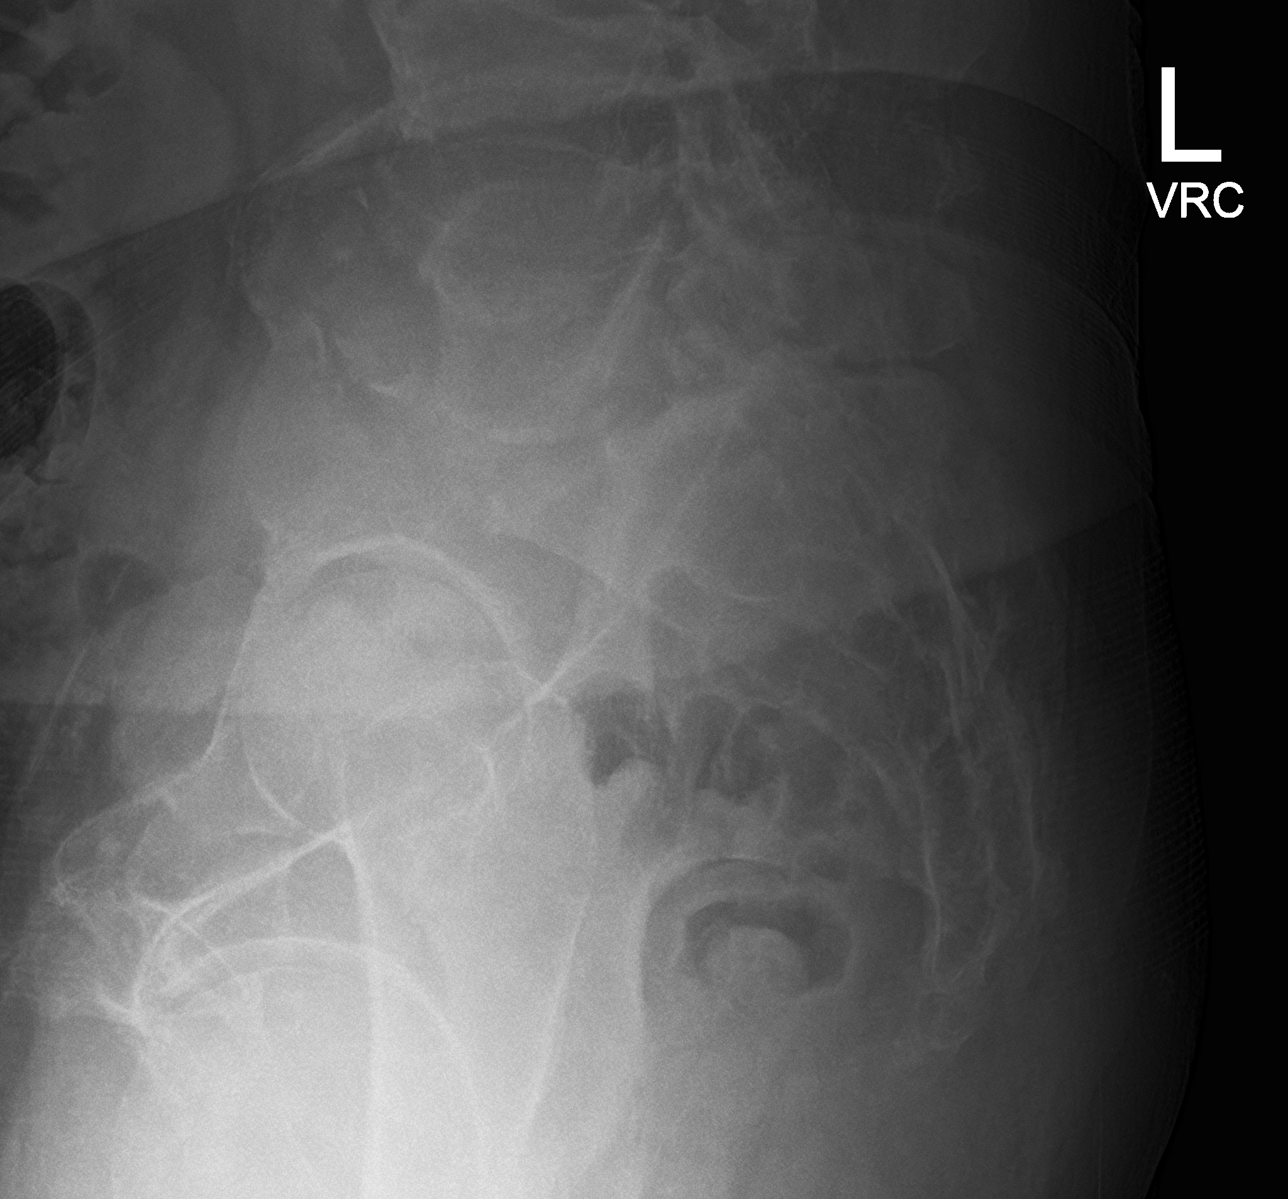

[5 of 5 positions shown; findings below may reference images not displayed]

FINDINGS: Osteopenia. There is mild dextroscoliosis apex T12-L1, with again
noted grade 1 L4-5 anterolisthesis probably related facet
hypertrophy.

No other alignment abnormality is seen. No acute fracture is
evident.

There is mild chronic anterior wedging of the L1 and L2 vertebral
bodies, additional mild chronic wedging in the lower lumbar spine at
T10 and T11, all unchanged.

There is moderate marginal osteophytosis throughout with anterior
bridging osteophytes T11-12, T12-L1 and L1-2 and left lateral
bridging osteophytes L3-4.

Facet hypertrophy noted moderately at the lowest 3 levels with
foraminal stenosis.

Stable partial disc space loss again noted all lumbar levels except
L5-S1.

Left SI joint is patent with bridging osteophytes of the superior
right SI joint. Aortic atherosclerosis.
IMPRESSION: Osteopenia, scoliosis and degenerative changes, and mild chronic
wedging of the lower thoracic and upper lumbar spine. Stable grade 1
degenerative L4-5 anterolisthesis. No evidence of fractures or
interval changes.

## 2022-08-06 IMAGING — DX DG HIP (WITH OR WITHOUT PELVIS) 5+V BILAT
5 series · 5 of 5 positions shown · non-contrast
Comparison: None.

CLINICAL DATA: Left hip pain

EXAM:
DG HIP (WITH OR WITHOUT PELVIS) 5+V BILAT

[hip lat (1 of 2)]
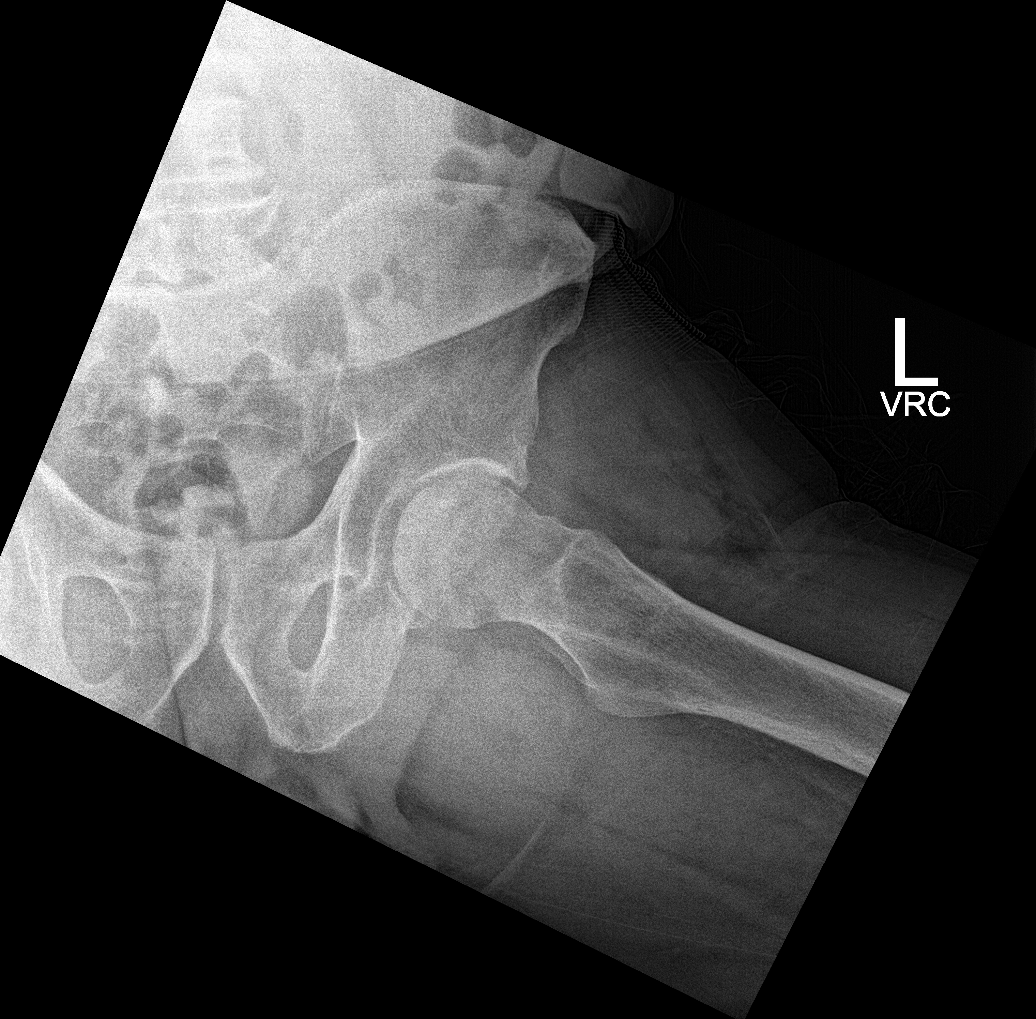

[hip ap (1 of 2)]
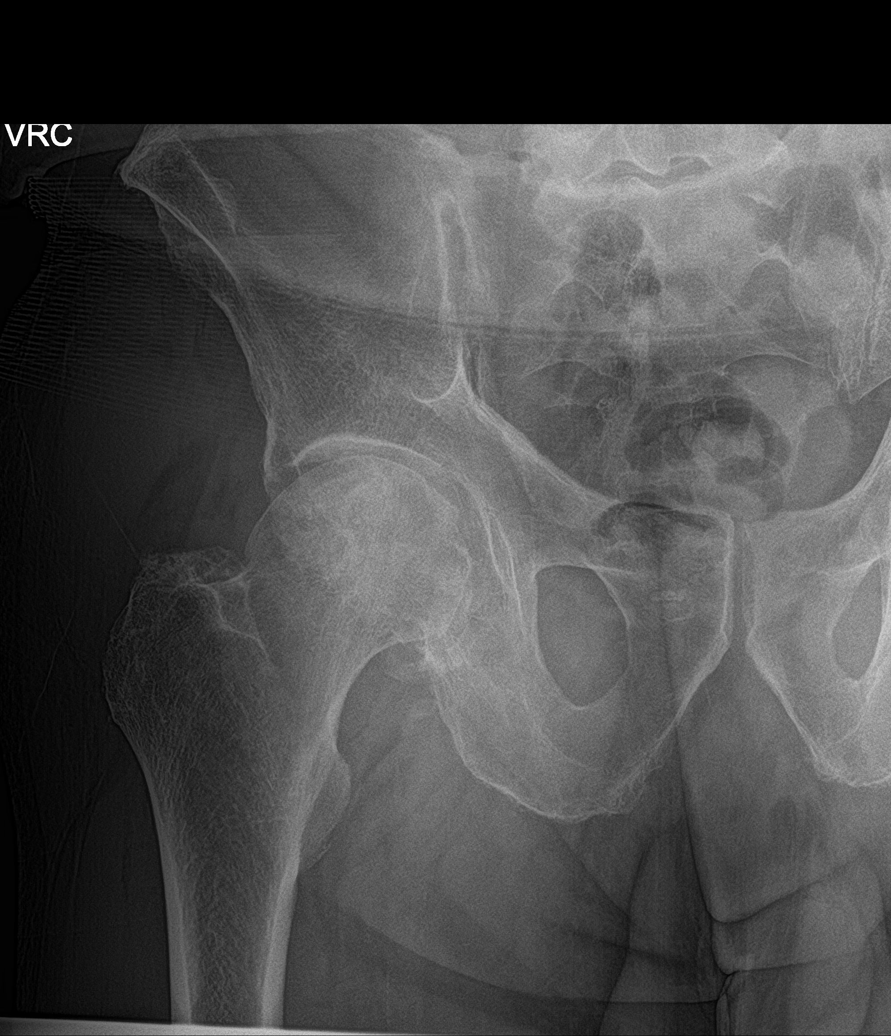

[hip lat (2 of 2)]
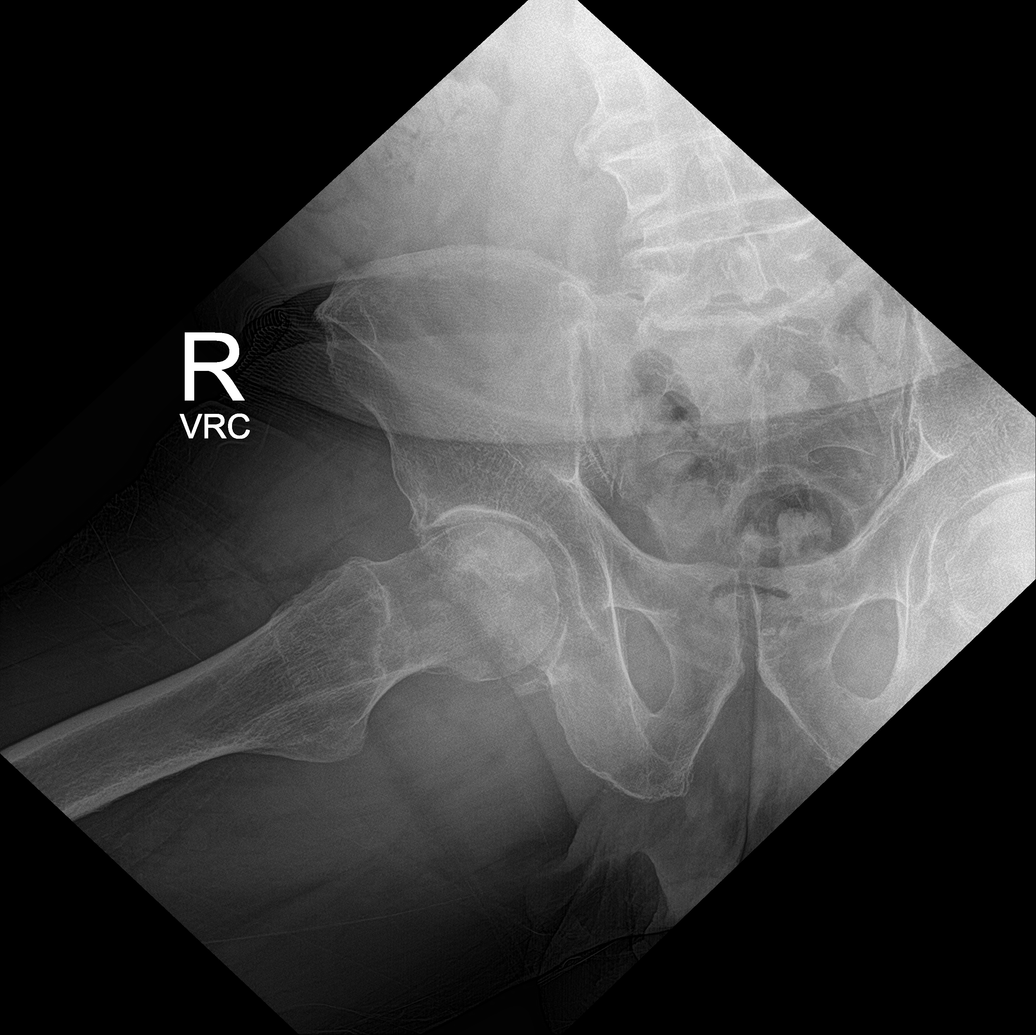

[hip ap (2 of 2)]
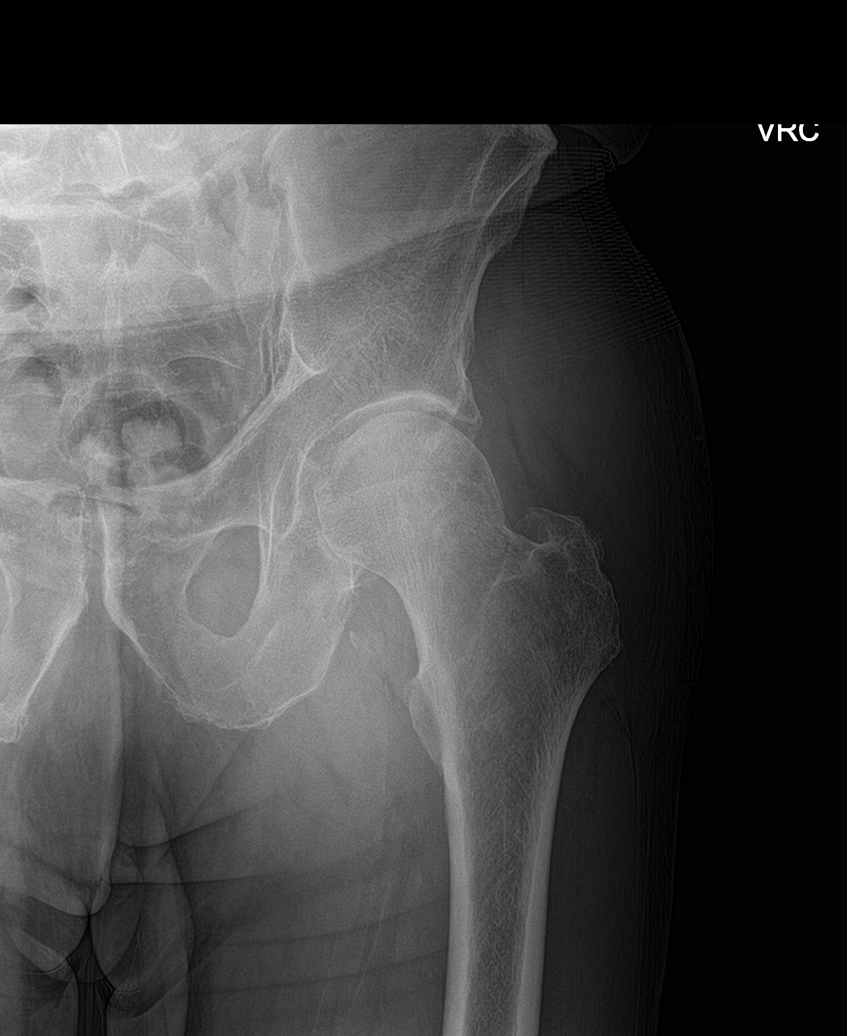

[pelvis ap]
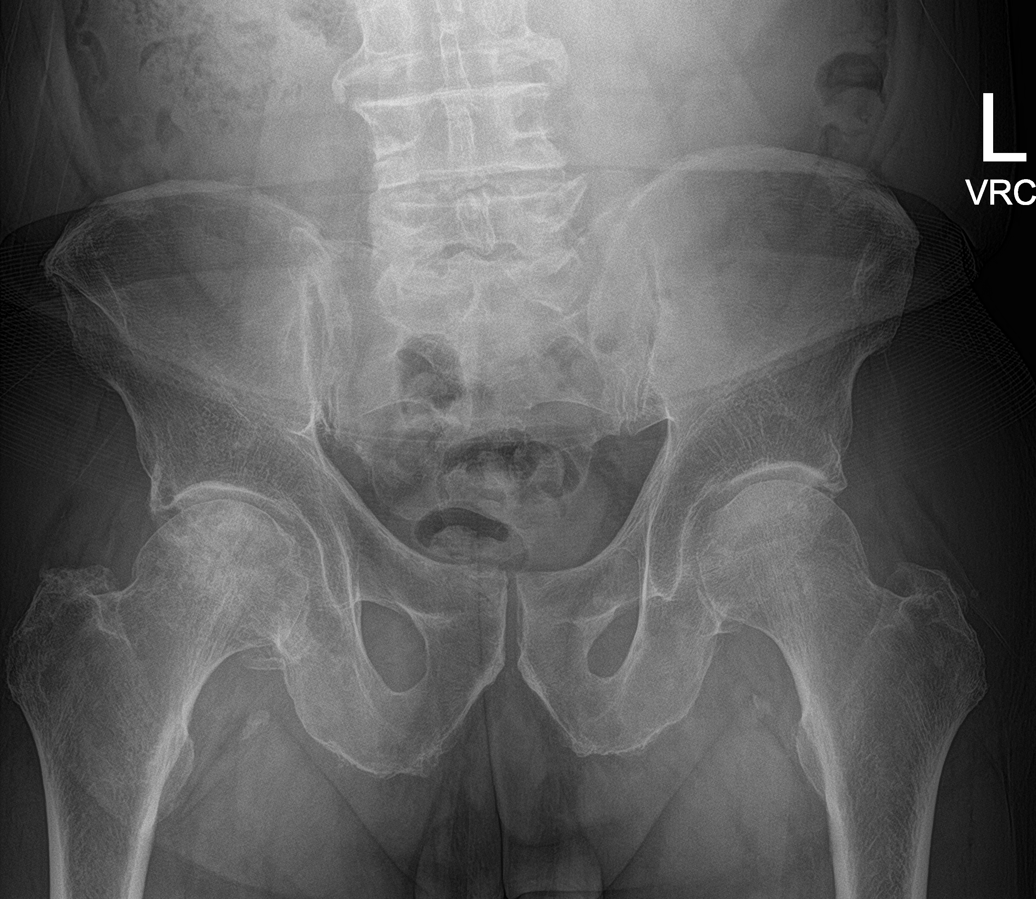

[5 of 5 positions shown; findings below may reference images not displayed]

FINDINGS: Normal alignment. No acute fracture or dislocation. Mixed lytic and
sclerotic changes within the right femoral head are in keeping with
avascular necrosis without evidence of articular collapse. Mild
right and hip degenerative arthritis with osteophyte formation. Left
hip joint space is preserved and the femoral head appears
unremarkable. Soft tissues are unremarkable.
IMPRESSION: Right hip AVN with mild right hip degenerative arthritis.

## 2022-10-25 NOTE — Progress Notes (Unsigned)
Subjective:   Austin Atkins is a 77 y.o. male who presents for Medicare Annual/Subsequent preventive examination.  Visit Complete: {[id number]:[phone]}  Patient Medicare AWV questionnaire was completed by the patient on ***; I have confirmed that all information answered by patient is correct and no changes since this date.        Objective:    There were no vitals filed for this visit. There is no height or weight on file to calculate BMI.     10/24/2021   11:25 AM 05/18/2021   10:21 AM 10/21/2020    3:53 PM 03/10/2019    8:49 AM  Advanced Directives  Does Patient Have a Medical Advance Directive? Yes No Yes Yes  Type of Estate agent of [place];Living will  Healthcare Power of [place];Living will Healthcare Power of [place];Living will  Does patient want to make changes to medical advance directive?   No - Patient declined   Copy of Healthcare Power of Attorney in Chart?   No - copy requested   Would patient like information on creating a medical advance directive?  No - Patient declined      Current Medications (verified) Outpatient Encounter Medications as of 10/26/2022  Medication Sig   amLODipine (NORVASC) 2.5 MG tablet TAKE 1 TABLET (2.5 MG TOTAL) BY MOUTH DAILY.   Ascorbic Acid (VITAMIN C) 1000 MG tablet Take 1,000 mg by mouth daily.   aspirin EC 81 MG tablet Take 81 mg by mouth daily.   Calcium-Magnesium-Vitamin D (CALCIUM 1200+D3 PO) Take 1 tablet by mouth daily.   meclizine (ANTIVERT) 25 MG tablet Take 1 tablet (25 mg total) by mouth 3 (three) times daily as needed for dizziness.   Multiple Vitamin (MULTIVITAMIN) capsule Take 1 capsule by mouth daily.   ondansetron (ZOFRAN) 4 MG tablet Take 1 tablet (4 mg total) by mouth every 8 (eight) hours as needed for nausea or vomiting.   simvastatin (ZOCOR) 10 MG tablet TAKE 1 TABLET AT BEDTIME   valsartan-hydrochlorothiazide (DIOVAN-HCT) 320-25 MG tablet Take 1 tablet by mouth daily.   No  facility-administered encounter medications on file as of 10/26/2022.    Allergies (verified) Patient has no known allergies.   History: Past Medical History:  Diagnosis Date   Arthritis    Atherosclerosis of aorta (HCC)    CAD (coronary artery disease)    Erectile dysfunction    High blood pressure    High cholesterol    Other acute pancreatitis without necrosis or infection    Prediabetes    Past Surgical History:  Procedure Laterality Date   HERNIA REPAIR     TRANSFORAMINAL LUMBAR INTERBODY FUSION (TLIF) WITH PEDICLE SCREW FIXATION 2 LEVEL Right 05/25/2021   Procedure: RIGHT-SIDED LUMBAR 3- LUMBAR 4, LUMBAR 4- LUMBAR 5 TRANSFORAMINAL LUMBAR INTERBODY FUSION AND DECOMPRESSION WITH INSTRUMENTATION AND ALLOGRAFT;  Surgeon: Estill Bamberg, MD;  Location: [facility] OR;  Service: Orthopedics;  Laterality: Right;   Family History  Problem Relation Age of Onset   Cancer Mother    Heart failure Father    Kidney Stones Father    Hypertension Other    Cancer Other    Social History   Socioeconomic History   Marital status: Married    Spouse name: Austin Atkins   Number of children: 1   Years of education: Not on file   Highest education level: Not on file  Occupational History   Occupation: Magazine features editor: [employer]  Tobacco Use   Smoking status: Former  Current packs/day: 0.00    Types: Cigarettes    Quit date: 1980    Years since quitting: 44.8   Smokeless tobacco: Never  Vaping Use   Vaping status: Never Used  Substance and Sexual Activity   Alcohol use: Not Currently    Alcohol/week: 2.0 standard drinks of alcohol    Types: 1 Glasses of wine, 1 Cans of beer per week   Drug use: Never   Sexual activity: Not Currently  Other Topics Concern   Not on file  Social History Narrative   Not on file   Social Determinants of Health   Financial Resource Strain: Low Risk  (10/24/2021)   Overall Financial Resource Strain (CARDIA)    Difficulty of Paying Living  Expenses: Not hard at all  Food Insecurity: No Food Insecurity (10/24/2021)   Hunger Vital Sign    Worried About Running Out of Food in the Last Year: Never true    Ran Out of Food in the Last Year: Never true  Transportation Needs: No Transportation Needs (10/24/2021)   PRAPARE - Administrator, Civil Service (Medical): No    Lack of Transportation (Non-Medical): No  Physical Activity: Insufficiently Active (10/24/2021)   Exercise Vital Sign    Days of Exercise per Week: 4 days    Minutes of Exercise per Session: 30 min  Stress: No Stress Concern Present (10/24/2021)   [employer] of Occupational Health - Occupational Stress Questionnaire    Feeling of Stress : Not at all  Social Connections: Moderately Isolated (10/24/2021)   Social Connection and Isolation Panel [NHANES]    Frequency of Communication with Friends and Family: More than three times a week    Frequency of Social Gatherings with Friends and Family: More than three times a week    Attends Religious Services: Never    Database administrator or Organizations: No    Attends Banker Meetings: Never    Marital Status: Married    Tobacco Counseling Counseling given: Not Answered   Clinical Intake:                        Activities of Daily Living    05/02/2022    8:28 AM  In your present state of health, do you have any difficulty performing the following activities:  Hearing? 0  Vision? 0  Difficulty concentrating or making decisions? 0  Walking or climbing stairs? 0  Dressing or bathing? 0  Doing errands, shopping? 0    Patient Care Team: Blane Ohara, MD as PCP - General (Family Medicine) Earvin Hansen, [facility] (Inactive) as Pharmacist (Pharmacist)  Indicate any recent Medical Services you may have received from other than [facility] providers in the past year (date may be approximate).     Assessment:   This is a routine wellness examination for  Austin Atkins.  Hearing/Vision screen No results found.   Goals Addressed   None   Depression Screen    05/02/2022    8:28 AM 10/24/2021   11:17 AM 08/09/2021    4:10 PM 10/21/2020    3:52 PM 03/10/2019    9:34 AM  PHQ 2/9 Scores  PHQ - 2 Score 0 0 0 0 0    Fall Risk    05/02/2022    8:28 AM 10/24/2021   11:18 AM 08/09/2021    4:10 PM 10/21/2020    3:53 PM 03/10/2019    9:33 AM  Fall Risk  Falls in the past year? 0 0 0 0 1  Number falls in past yr: 0 0 0 0 0  Injury with Fall? 0 0 0 0 0  Risk for fall due to : No Fall Risks No Fall Risks No Fall Risks No Fall Risks   Follow up Falls evaluation completed Falls evaluation completed Falls evaluation completed Falls evaluation completed Falls prevention discussed    MEDICARE RISK AT HOME:    TIMED UP AND GO:  Was the test performed?  {[id number]:[phone]}    Cognitive Function:        10/21/2020    3:55 PM  6CIT Screen  What Year? 0 points  What month? 0 points  What time? 0 points  Count back from 20 0 points  Months in reverse 0 points  Repeat phrase 0 points  Total Score 0 points    Immunizations Immunization History  Administered Date(s) Administered   Fluad Quad(high Dose 65+) 09/11/2018, 10/06/2019, 10/21/2020, 09/26/2021   Influenza-Unspecified 10/02/2017   Moderna Covid-19 Vaccine Bivalent Booster 72yrs & up 12/15/2020   Moderna SARS-COV2 Booster Vaccination 11/04/2019, 07/13/2020   Moderna Sars-Covid-2 Vaccination 01/23/2019, 02/26/2019   Pfizer(Comirnaty)Fall Seasonal Vaccine 12 years and older 09/26/2021   Pneumococcal Conjugate-13 08/15/2016   Pneumococcal Polysaccharide-23 12/07/2010, 10/21/2020   Td 12/07/2010   Tdap 10/31/2021   Zoster Recombinant(Shingrix) 09/11/2018, 11/20/2018    Screening Tests Health Maintenance  Topic Date Due   INFLUENZA VACCINE  08/03/2022   COVID-19 Vaccine (7 - 2023-24 season) 09/03/2022   Medicare Annual Wellness (AWV)  10/26/2023   Colonoscopy  05/17/2028    DTaP/Tdap/Td (3 - Td or Tdap) 11/01/2031   Pneumonia Vaccine 72+ Years old  Completed   Hepatitis C Screening  Completed   Zoster Vaccines- Shingrix  Completed   HPV VACCINES  Aged Out    Health Maintenance  Health Maintenance Due  Topic Date Due   INFLUENZA VACCINE  08/03/2022   COVID-19 Vaccine (7 - 2023-24 season) 09/03/2022    Additional Screening:  Vision Screening: Recommended annual ophthalmology exams for early detection of glaucoma and other disorders of the eye. Is the patient up to date with their annual eye exam?  {YES/NO:21197} Who is the provider or what is the name of the office in which the patient attends annual eye exams? *** If pt is not established with a provider, would they like to be referred to a provider to establish care? {YES/NO:21197}.   Dental Screening: Recommended annual dental exams for proper oral hygiene  Community Resource Referral / Chronic Care Management: CRR required this visit?  No   CCM required this visit?  No     Plan:     I have personally reviewed and noted the following in the patient's chart:   Medical and social history Use of alcohol, tobacco or illicit drugs  Current medications and supplements including opioid prescriptions. Patient is not currently taking opioid prescriptions. Functional ability and status Nutritional status Physical activity Advanced directives List of other physicians Hospitalizations, surgeries, and ER visits in previous 12 months Vitals Screenings to include cognitive, depression, and falls Referrals and appointments  In addition, I have reviewed and discussed with patient certain preventive protocols, quality metrics, and best practice recommendations. A written personalized care plan for preventive services as well as general preventive health recommendations were provided to patient.

## 2022-10-26 ENCOUNTER — Encounter: Payer: Self-pay | Admitting: Family Medicine

## 2022-10-26 ENCOUNTER — Ambulatory Visit: Payer: Medicare PPO | Admitting: Family Medicine

## 2022-10-26 VITALS — BP 134/68 | HR 68 | Temp 97.8°F | Ht 68.5 in | Wt 188.0 lb

## 2022-10-26 DIAGNOSIS — I10 Essential (primary) hypertension: Secondary | ICD-10-CM

## 2022-10-26 DIAGNOSIS — E663 Overweight: Secondary | ICD-10-CM

## 2022-10-26 DIAGNOSIS — Z6828 Body mass index (BMI) 28.0-28.9, adult: Secondary | ICD-10-CM | POA: Insufficient documentation

## 2022-10-26 DIAGNOSIS — Z23 Encounter for immunization: Secondary | ICD-10-CM | POA: Insufficient documentation

## 2022-10-26 DIAGNOSIS — E782 Mixed hyperlipidemia: Secondary | ICD-10-CM | POA: Diagnosis not present

## 2022-10-26 DIAGNOSIS — R7301 Impaired fasting glucose: Secondary | ICD-10-CM

## 2022-10-26 DIAGNOSIS — Z0000 Encounter for general adult medical examination without abnormal findings: Secondary | ICD-10-CM | POA: Diagnosis not present

## 2022-10-26 DIAGNOSIS — I700 Atherosclerosis of aorta: Secondary | ICD-10-CM | POA: Diagnosis not present

## 2022-10-26 NOTE — Assessment & Plan Note (Signed)
Recommend get RSV at the pharmacy.  Things to do to keep yourself healthy  - Exercise at least 30-45 minutes a day, 3-4 days a week.  - Eat a low-fat diet with lots of fruits and vegetables, up to 7-9 servings per day.  - Seatbelts can save your life. Wear them always.  - Smoke detectors on every level of your home, check batteries every year.  - Eye Doctor - have an eye exam every 1-2 years  - Dentist - recommended every 6 month cleanings. - Alcohol -  If you drink, do it moderately, less than 2 drinks per day.  - Health Care Power of Attorney. Please bring [place] a copy.  - Depression is common in our stressful world.If you're feeling down or losing interest in things you normally enjoy, please come in for a visit.

## 2022-10-26 NOTE — Patient Instructions (Addendum)
Recommend get RSV at the pharmacy.  Things to do to keep yourself healthy  - Exercise at least 30-45 minutes a day, 3-4 days a week.  - Eat a low-fat diet with lots of fruits and vegetables, up to 7-9 servings per day.  - Seatbelts can save your life. Wear them always.  - Smoke detectors on every level of your home, check batteries every year.  - Eye Doctor - have an eye exam every 1-2 years  - Dentist - recommended every 6 month cleanings. - Alcohol -  If you drink, do it moderately, less than 2 drinks per day.  - Health Care Power of Attorney. Please bring [place] a copy.  - Depression is common in our stressful world.If you're feeling down or losing interest in things you normally enjoy, please come in for a visit.

## 2022-10-26 NOTE — Assessment & Plan Note (Signed)
Continue simvastatin 10 mg one before bed and aspirin 81 mg daily.

## 2022-10-26 NOTE — Assessment & Plan Note (Signed)
Recommend continue to work on eating healthy diet and exercise.  

## 2022-10-26 NOTE — Assessment & Plan Note (Signed)
At goal. Recommend continue to work on eating healthy diet and exercise. Continue valsartan/hydrochlorothiazide 320-25 mg daily

## 2022-10-26 NOTE — Assessment & Plan Note (Signed)
Well controlled.  No changes to medicines. Simvastatin 10 mg daily. Continue to work on eating a healthy diet and exercise.  Labs drawn today.

## 2022-10-27 LAB — CBC WITH DIFFERENTIAL/PLATELET
Basophils Absolute: 0 10*3/uL (ref 0.0–0.2)
Basos: 0 %
EOS (ABSOLUTE): 0.2 10*3/uL (ref 0.0–0.4)
Eos: 3 %
Hematocrit: 48.1 % (ref 37.5–51.0)
Hemoglobin: 15.5 g/dL (ref 13.0–17.7)
Immature Grans (Abs): 0 10*3/uL (ref 0.0–0.1)
Immature Granulocytes: 0 %
Lymphocytes Absolute: 1.6 10*3/uL (ref 0.7–3.1)
Lymphs: 28 %
MCH: 29.9 pg (ref 26.6–33.0)
MCHC: 32.2 g/dL (ref 31.5–35.7)
MCV: 93 fL (ref 79–97)
Monocytes Absolute: 0.4 10*3/uL (ref 0.1–0.9)
Monocytes: 7 %
Neutrophils Absolute: 3.5 10*3/uL (ref 1.4–7.0)
Neutrophils: 62 %
Platelets: 231 10*3/uL (ref 150–450)
RBC: 5.18 x10E6/uL (ref 4.14–5.80)
RDW: 12.1 % (ref 11.6–15.4)
WBC: 5.8 10*3/uL (ref 3.4–10.8)

## 2022-10-27 LAB — LIPID PANEL
Chol/HDL Ratio: 2.7 ratio (ref 0.0–5.0)
Cholesterol, Total: 144 mg/dL (ref 100–199)
HDL: 53 mg/dL (ref >39)
LDL Chol Calc (NIH): 76 mg/dL (ref 0–99)
Triglycerides: 78 mg/dL (ref 0–149)
VLDL Cholesterol Cal: 15 mg/dL (ref 5–40)

## 2022-10-27 LAB — COMPREHENSIVE METABOLIC PANEL
ALT: 30 [IU]/L (ref 0–44)
AST: 30 [IU]/L (ref 0–40)
Albumin: 4.2 g/dL (ref 3.8–4.8)
Alkaline Phosphatase: 107 [IU]/L (ref 44–121)
BUN/Creatinine Ratio: 12 (ref 10–24)
BUN: 10 mg/dL (ref 8–27)
Bilirubin Total: 0.7 mg/dL (ref 0.0–1.2)
CO2: 28 mmol/L (ref 20–29)
Calcium: 9.7 mg/dL (ref 8.6–10.2)
Chloride: 104 mmol/L (ref 96–106)
Creatinine, Ser: 0.82 mg/dL (ref 0.76–1.27)
Globulin, Total: 1.9 g/dL (ref 1.5–4.5)
Glucose: 111 mg/dL — ABNORMAL HIGH (ref 70–99)
Potassium: 4.7 mmol/L (ref 3.5–5.2)
Sodium: 146 mmol/L — ABNORMAL HIGH (ref 134–144)
Total Protein: 6.1 g/dL (ref 6.0–8.5)
eGFR: 92 mL/min/{1.73_m2} (ref >59)

## 2022-10-27 LAB — HEMOGLOBIN A1C
Est. average glucose Bld gHb Est-mCnc: 128 mg/dL
Hgb A1c MFr Bld: 6.1 % — ABNORMAL HIGH (ref 4.8–5.6)

## 2022-11-04 IMAGING — CR DG LUMBAR SPINE 1V
1 series · 1 of 1 positions shown · non-contrast
Comparison: [DATE] [DATE], [DATE].  [DATE] [DATE], [DATE].

CLINICAL DATA: Surgical localization.

EXAM:
LUMBAR SPINE - 1 VIEW

[xtable lateral]
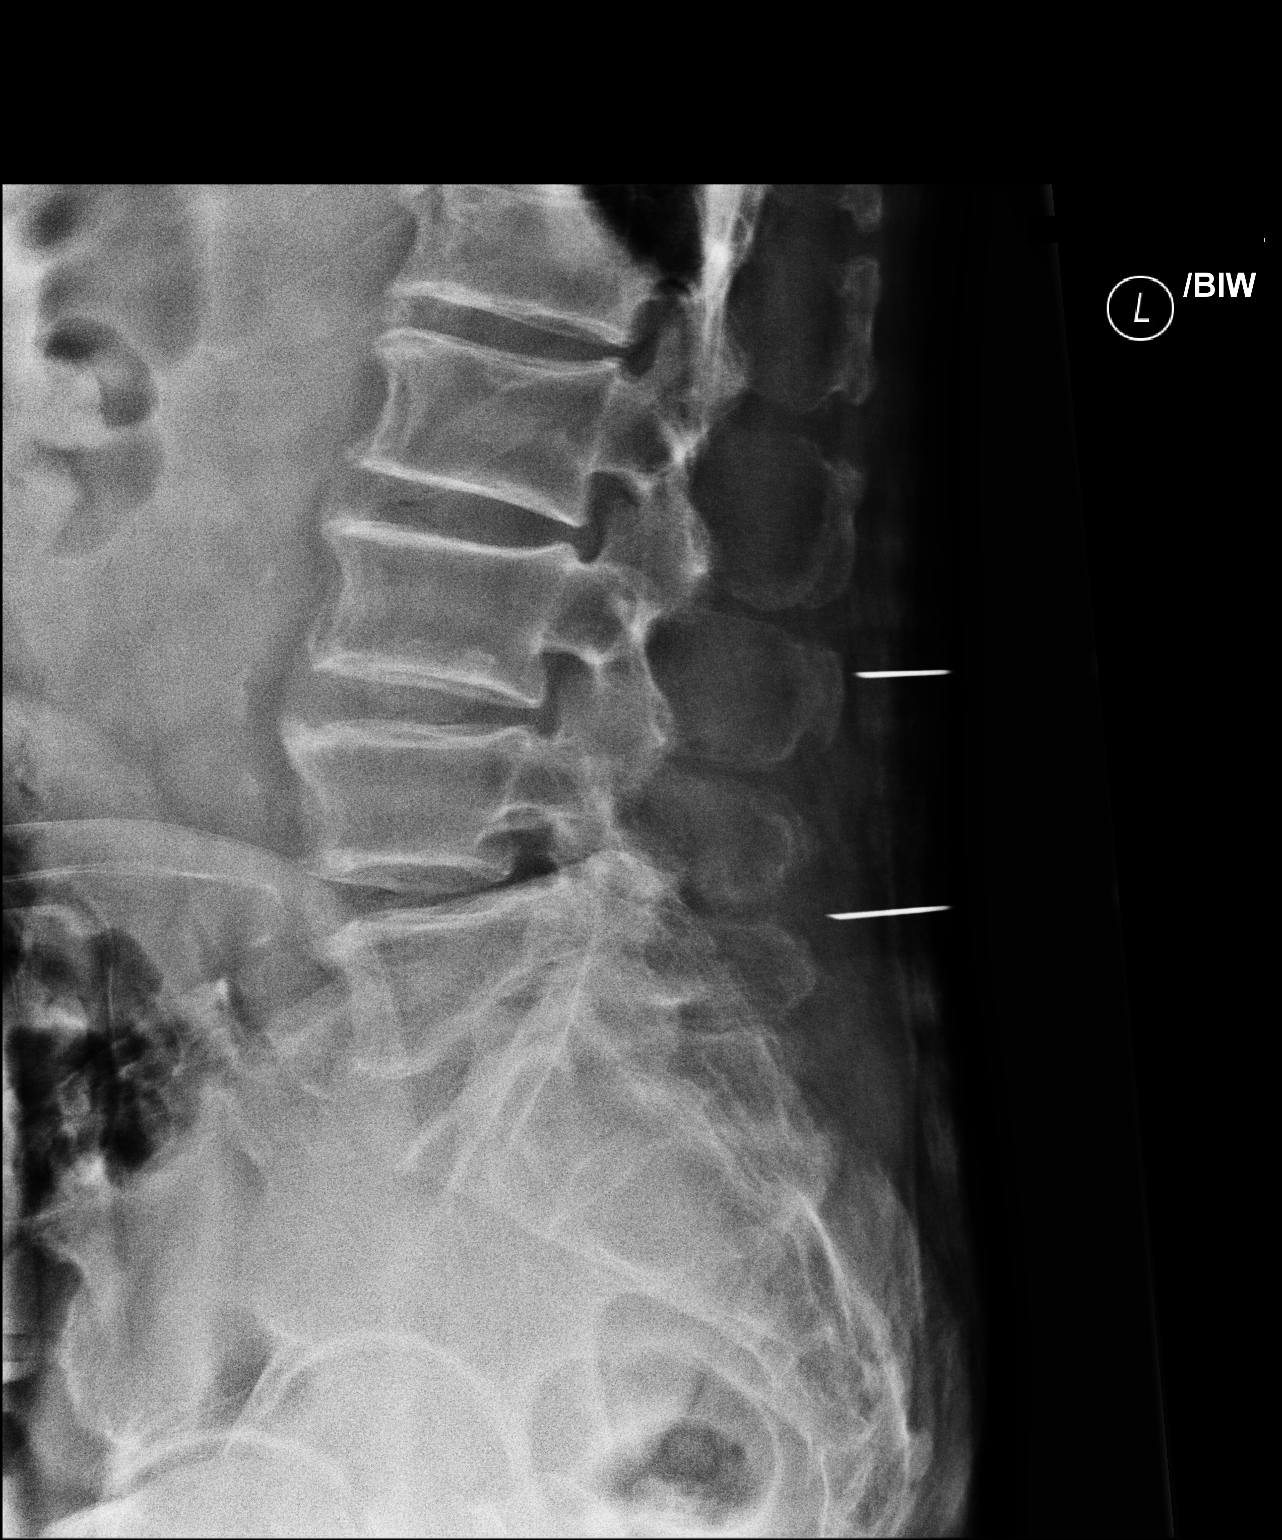

[1 of 1 positions shown; findings below may reference images not displayed]

FINDINGS: Single intraoperative cross-table lateral projection was obtained of
the lumbar spine. This image demonstrates surgical probes directed
toward the posterior spinous process of L3 as well as the posterior
interspinous space of L4-5.
IMPRESSION: Surgical localization as described above.

## 2022-11-04 IMAGING — RF DG LUMBAR SPINE 2-3V
1 series · 2 of 2 positions shown · non-contrast
Comparison: Radiograph dated [REDACTED] 0608

CLINICAL DATA: L3-L5 posterior fusion

EXAM:
LUMBAR SPINE - 2 VIEW

[Series 1: run · 2 of 2 slices shown]
[im 1/2]
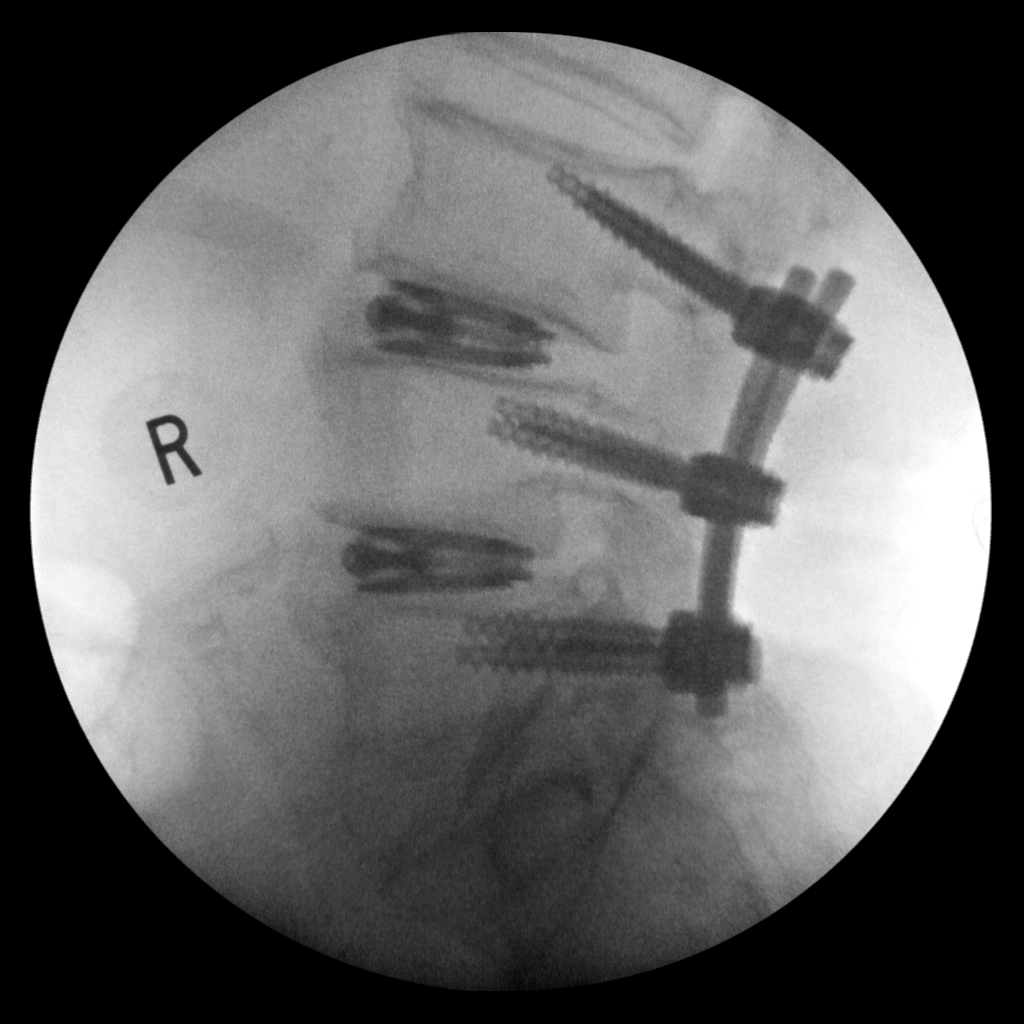
[im 2/2]
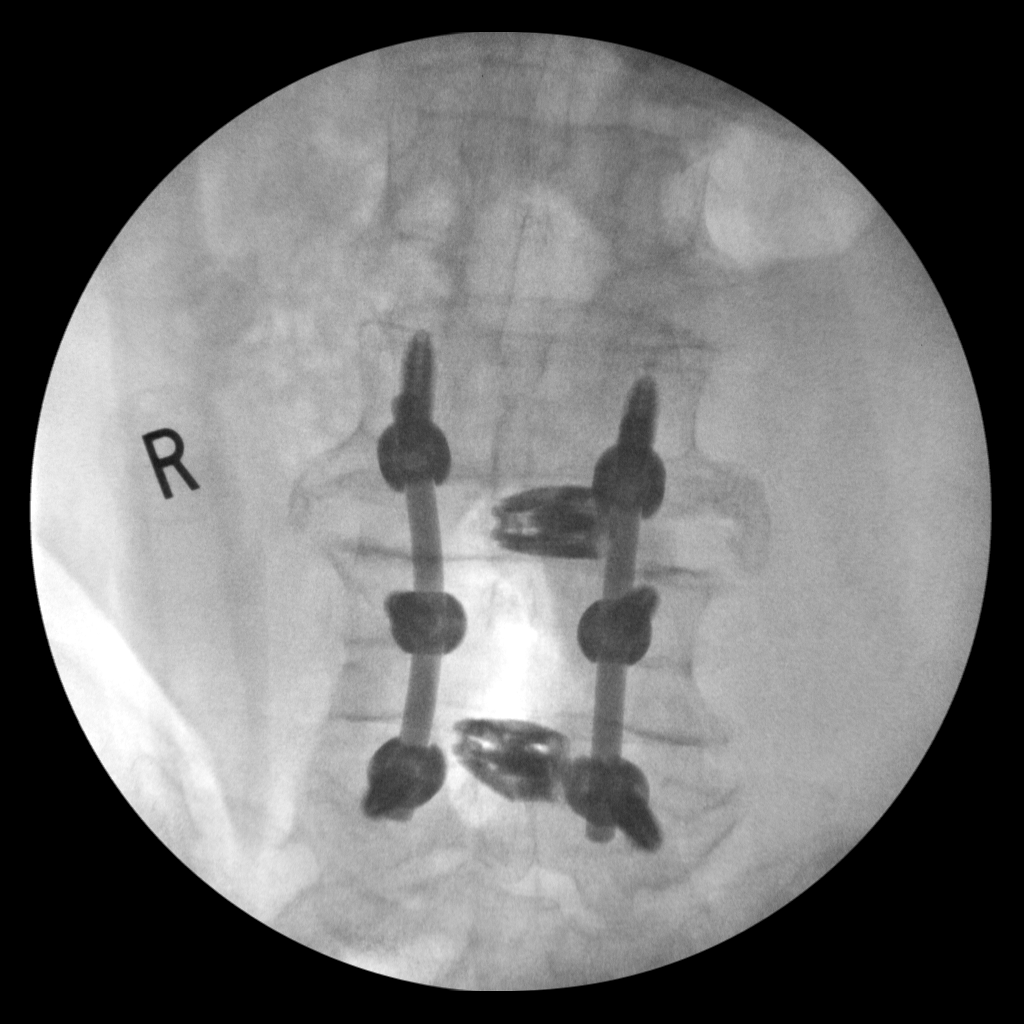

[2 of 2 positions shown; findings below may reference images not displayed]

FINDINGS: Fluoroscopic images were obtained intraoperatively and submitted for
post operative interpretation. L3-L5 posterior fusion with hardware
in expected position, 3 images were obtained with 60 seconds of
fluoroscopy time and 52.3 mGy. Please see the performing provider's
procedural report for further detail.
IMPRESSION: Intra op fluoroscopic images of L3-L5 posterior fusion.

## 2022-12-04 DIAGNOSIS — L570 Actinic keratosis: Secondary | ICD-10-CM | POA: Diagnosis not present

## 2022-12-04 DIAGNOSIS — L820 Inflamed seborrheic keratosis: Secondary | ICD-10-CM | POA: Diagnosis not present

## 2022-12-04 DIAGNOSIS — L578 Other skin changes due to chronic exposure to nonionizing radiation: Secondary | ICD-10-CM | POA: Diagnosis not present

## 2022-12-04 DIAGNOSIS — L814 Other melanin hyperpigmentation: Secondary | ICD-10-CM | POA: Diagnosis not present

## 2022-12-20 ENCOUNTER — Other Ambulatory Visit: Payer: Self-pay | Admitting: Family Medicine

## 2022-12-25 DIAGNOSIS — R0981 Nasal congestion: Secondary | ICD-10-CM | POA: Diagnosis not present

## 2022-12-25 DIAGNOSIS — R059 Cough, unspecified: Secondary | ICD-10-CM | POA: Diagnosis not present

## 2022-12-25 DIAGNOSIS — R070 Pain in throat: Secondary | ICD-10-CM | POA: Diagnosis not present

## 2023-01-09 DIAGNOSIS — M25562 Pain in left knee: Secondary | ICD-10-CM | POA: Diagnosis not present

## 2023-01-18 DIAGNOSIS — R200 Anesthesia of skin: Secondary | ICD-10-CM | POA: Diagnosis not present

## 2023-01-18 DIAGNOSIS — M19022 Primary osteoarthritis, left elbow: Secondary | ICD-10-CM | POA: Diagnosis not present

## 2023-02-07 DIAGNOSIS — G5602 Carpal tunnel syndrome, left upper limb: Secondary | ICD-10-CM | POA: Diagnosis not present

## 2023-02-13 DIAGNOSIS — M25562 Pain in left knee: Secondary | ICD-10-CM | POA: Diagnosis not present

## 2023-02-19 DIAGNOSIS — M25562 Pain in left knee: Secondary | ICD-10-CM | POA: Diagnosis not present

## 2023-02-22 DIAGNOSIS — G5602 Carpal tunnel syndrome, left upper limb: Secondary | ICD-10-CM | POA: Diagnosis not present

## 2023-02-22 DIAGNOSIS — G5622 Lesion of ulnar nerve, left upper limb: Secondary | ICD-10-CM | POA: Diagnosis not present

## 2023-02-25 ENCOUNTER — Other Ambulatory Visit: Payer: Self-pay | Admitting: Family Medicine

## 2023-02-27 DIAGNOSIS — M25562 Pain in left knee: Secondary | ICD-10-CM | POA: Diagnosis not present

## 2023-03-14 DIAGNOSIS — G5622 Lesion of ulnar nerve, left upper limb: Secondary | ICD-10-CM | POA: Diagnosis not present

## 2023-03-14 DIAGNOSIS — G5602 Carpal tunnel syndrome, left upper limb: Secondary | ICD-10-CM | POA: Diagnosis not present

## 2023-04-03 DIAGNOSIS — S83242A Other tear of medial meniscus, current injury, left knee, initial encounter: Secondary | ICD-10-CM | POA: Diagnosis not present

## 2023-04-11 DIAGNOSIS — M25662 Stiffness of left knee, not elsewhere classified: Secondary | ICD-10-CM | POA: Diagnosis not present

## 2023-04-11 DIAGNOSIS — M25562 Pain in left knee: Secondary | ICD-10-CM | POA: Diagnosis not present

## 2023-04-16 DIAGNOSIS — M25562 Pain in left knee: Secondary | ICD-10-CM | POA: Diagnosis not present

## 2023-04-16 DIAGNOSIS — M25662 Stiffness of left knee, not elsewhere classified: Secondary | ICD-10-CM | POA: Diagnosis not present

## 2023-04-18 DIAGNOSIS — M25562 Pain in left knee: Secondary | ICD-10-CM | POA: Diagnosis not present

## 2023-04-18 DIAGNOSIS — M25662 Stiffness of left knee, not elsewhere classified: Secondary | ICD-10-CM | POA: Diagnosis not present

## 2023-04-23 DIAGNOSIS — M25562 Pain in left knee: Secondary | ICD-10-CM | POA: Diagnosis not present

## 2023-04-23 DIAGNOSIS — M25662 Stiffness of left knee, not elsewhere classified: Secondary | ICD-10-CM | POA: Diagnosis not present

## 2023-04-25 DIAGNOSIS — M25562 Pain in left knee: Secondary | ICD-10-CM | POA: Diagnosis not present

## 2023-04-25 DIAGNOSIS — M25662 Stiffness of left knee, not elsewhere classified: Secondary | ICD-10-CM | POA: Diagnosis not present

## 2023-04-25 NOTE — Progress Notes (Unsigned)
 Subjective:  Patient ID: Austin Atkins, male    DOB: 1945/05/16  Age: 78 y.o. MRN: [id number]  Chief Complaint  Patient presents with   Medical Management of Chronic Issues    HPI: Hypertension: Taking amlodipine  2.5 mg daily, Valsartan -HCTZ 320-25 mg daily.  Hyperllpidemia with atherosclerosis: Currently on simvastatin  10 mg daily and aspirin 81 mg daily.  Prediabetic: Last A1C 6.1%, eating healthy.  Eating healthy and exercises.  Left meniscus tear: going to physical therapy and orthopedics.      04/26/2023    8:03 AM 10/26/2022    8:08 AM 05/02/2022    8:28 AM 10/24/2021   11:17 AM 08/09/2021    4:10 PM  Depression screen PHQ 2/9  Decreased Interest 0 0 0 0 0  Down, Depressed, Hopeless 0  0 0 0  PHQ - 2 Score 0 0 0 0 0        04/26/2023    8:03 AM  Fall Risk   Falls in the past year? 0  Number falls in past yr: 0  Injury with Fall? 0  Risk for fall due to : No Fall Risks    Patient Care Team: Mercy Stall, MD as PCP - General (Family Medicine) Steven Elam, [facility] (Inactive) as Pharmacist (Pharmacist)   Review of Systems  Constitutional:  Negative for chills, fatigue and fever.  HENT:  Negative for congestion, ear pain, sinus pressure and sore throat.   Respiratory:  Negative for cough and shortness of breath.   Cardiovascular:  Negative for chest pain.  Gastrointestinal:  Negative for abdominal pain, constipation, diarrhea, nausea and vomiting.  Genitourinary:  Negative for dysuria and frequency.  Musculoskeletal:  Positive for arthralgias. Negative for back pain and myalgias.  Neurological:  Negative for dizziness and headaches.  Psychiatric/Behavioral:  Negative for dysphoric mood. The patient is not nervous/anxious.     Current Outpatient Medications on File Prior to Visit  Medication Sig Dispense Refill   amLODipine  (NORVASC ) 2.5 MG tablet TAKE 1 TABLET EVERY DAY 90 tablet 1   Ascorbic Acid  (VITAMIN C) 1000 MG tablet Take 1,000 mg by mouth daily.      aspirin EC 81 MG tablet Take 81 mg by mouth daily.     Calcium-Magnesium-Vitamin D (CALCIUM 1200+D3 PO) Take 1 tablet by mouth daily.     Multiple Vitamin (MULTIVITAMIN) capsule Take 1 capsule by mouth daily.     simvastatin  (ZOCOR ) 10 MG tablet TAKE 1 TABLET AT BEDTIME 90 tablet 3   valsartan -hydrochlorothiazide  (DIOVAN -HCT) 320-25 MG tablet TAKE 1 TABLET EVERY DAY 90 tablet 3   No current facility-administered medications on file prior to visit.   Past Medical History:  Diagnosis Date   Arthritis    Atherosclerosis of aorta (HCC)    Avascular necrosis of bone of right hip (HCC) 03/01/2021   Avascular necrosis of bones of both hips (HCC) 12/11/2018   BPPV (benign paroxysmal positional vertigo) 09/26/2021   CAD (coronary artery disease)    Erectile dysfunction    High blood pressure    High cholesterol    Osteoarthritis of lumbar spine with myelopathy 03/01/2021   Other acute pancreatitis without necrosis or infection    Prediabetes    Past Surgical History:  Procedure Laterality Date   HERNIA REPAIR     TRANSFORAMINAL LUMBAR INTERBODY FUSION (TLIF) WITH PEDICLE SCREW FIXATION 2 LEVEL Right 05/25/2021   Procedure: RIGHT-SIDED LUMBAR 3- LUMBAR 4, LUMBAR 4- LUMBAR 5 TRANSFORAMINAL LUMBAR INTERBODY FUSION AND DECOMPRESSION WITH INSTRUMENTATION AND ALLOGRAFT;  Surgeon: Virl Grimes, MD;  Location: [facility] OR;  Service: Orthopedics;  Laterality: Right;    Family History  Problem Relation Age of Onset   Cancer Mother    Heart failure Father    Kidney Stones Father    Hypertension Other    Cancer Other    Social History   Socioeconomic History   Marital status: Married    Spouse name: Debria Fang   Number of children: 1   Years of education: Not on file   Highest education level: Not on file  Occupational History   Occupation: Magazine features editor: [employer]  Tobacco Use   Smoking status: Former    Current packs/day: 0.00    Types: Cigarettes    Quit date: 1980     Years since quitting: 45.3   Smokeless tobacco: Never  Vaping Use   Vaping status: Never Used  Substance and Sexual Activity   Alcohol use: Not Currently    Alcohol/week: 2.0 standard drinks of alcohol    Types: 1 Glasses of wine, 1 Cans of beer per week   Drug use: Never   Sexual activity: Not Currently  Other Topics Concern   Not on file  Social History Narrative   Not on file   Social Drivers of Health   Financial Resource Strain: Low Risk  (10/26/2022)   Overall Financial Resource Strain (CARDIA)    Difficulty of Paying Living Expenses: Not hard at all  Food Insecurity: No Food Insecurity (10/26/2022)   Hunger Vital Sign    Worried About Running Out of Food in the Last Year: Never true    Ran Out of Food in the Last Year: Never true  Transportation Needs: No Transportation Needs (10/26/2022)   PRAPARE - Administrator, Civil Service (Medical): No    Lack of Transportation (Non-Medical): No  Physical Activity: Insufficiently Active (10/26/2022)   Exercise Vital Sign    Days of Exercise per Week: 4 days    Minutes of Exercise per Session: 30 min  Stress: No Stress Concern Present (10/26/2022)   [employer] of Occupational Health - Occupational Stress Questionnaire    Feeling of Stress : Not at all  Social Connections: Moderately Isolated (10/26/2022)   Social Connection and Isolation Panel [NHANES]    Frequency of Communication with Friends and Family: More than three times a week    Frequency of Social Gatherings with Friends and Family: More than three times a week    Attends Religious Services: Never    Database administrator or Organizations: No    Attends Engineer, structural: Never    Marital Status: Married    Objective:  BP 122/80   Pulse 68   Temp (!) 97.5 F (36.4 C)   Ht 5' 8.5" (1.74 m)   Wt 189 lb 9.6 oz (86 kg)   SpO2 97%   BMI 28.41 kg/m      04/26/2023    8:00 AM 10/26/2022    8:06 AM 05/02/2022    8:27 AM   BP/Weight  Systolic BP 122 134 126  Diastolic BP 80 68 74  Wt. (Lbs) 189.6 188 193  BMI 28.41 kg/m2 28.17 kg/m2 29.35 kg/m2    Physical Exam Vitals reviewed.  Constitutional:      Appearance: Normal appearance.  Neck:     Vascular: No carotid bruit.  Cardiovascular:     Rate and Rhythm: Normal rate and regular rhythm.     Heart sounds: Normal  heart sounds.  Pulmonary:     Effort: Pulmonary effort is normal.     Breath sounds: Normal breath sounds. No wheezing, rhonchi or rales.  Abdominal:     General: Bowel sounds are normal.     Palpations: Abdomen is soft.     Tenderness: There is no abdominal tenderness.  Neurological:     Mental Status: He is alert and oriented to person, place, and time.  Psychiatric:        Mood and Affect: Mood normal.        Behavior: Behavior normal.     Diabetic Foot Exam - Simple   No data filed      Lab Results  Component Value Date   WBC 5.8 10/26/2022   HGB 15.5 10/26/2022   HCT 48.1 10/26/2022   PLT 231 10/26/2022   GLUCOSE 111 (H) 10/26/2022   CHOL 144 10/26/2022   TRIG 78 10/26/2022   HDL 53 10/26/2022   LDLCALC 76 10/26/2022   ALT 30 10/26/2022   AST 30 10/26/2022   NA 146 (H) 10/26/2022   K 4.7 10/26/2022   CL 104 10/26/2022   CREATININE 0.82 10/26/2022   BUN 10 10/26/2022   CO2 28 10/26/2022   TSH 2.650 08/09/2021   HGBA1C 6.1 (H) 10/26/2022      Assessment & Plan:  Essential hypertension, benign Assessment & Plan: At goal. Recommend continue to work on eating healthy diet and exercise. Continue amlodipine  2.5 mg once daily, valsartan /hydrochlorothiazide  320-25 mg daily.  Orders: -     CBC with Differential/Platelet -     Comprehensive metabolic panel with GFR  Impaired fasting glucose Assessment & Plan: Recommend continue to work on eating healthy diet and exercise.   Orders: -     Hemoglobin A1c  Benign prostatic hyperplasia with lower urinary tract symptoms, symptom details  unspecified Assessment & Plan: Asymptomatic.  Had discussion about psa for prostate screening. Patient agreed at this time he does not wish to follow.   Mixed hyperlipidemia Assessment & Plan: Well controlled.  No changes to medicines. Simvastatin  10 mg daily. Continue to work on eating a healthy diet and exercise.  Labs drawn today.   Orders: -     Lipid panel  Atherosclerosis of aorta (HCC) Assessment & Plan: Continue simvastatin  10 mg one before bed and aspirin 81 mg daily.    BMI 28.0-28.9,adult Assessment & Plan: Not overweight. Has muscle weight contributing.       No orders of the defined types were placed in this encounter.   Orders Placed This Encounter  Procedures   CBC with Differential/Platelet   Comprehensive metabolic panel with GFR   Hemoglobin A1c   Lipid panel     Follow-up: Return for chronic follow up, awv.   I,Marla I Leal-Borjas,acting as a scribe for Mercy Stall, MD.,have documented all relevant documentation on the behalf of Mercy Stall, MD,as directed by  Mercy Stall, MD while in the presence of Mercy Stall, MD.   An After Visit Summary was printed and given to the patient. I attest that I have reviewed this visit and agree with the plan scribed by my staff.   Mercy Stall, MD [facility] [phone]

## 2023-04-26 ENCOUNTER — Encounter: Payer: Self-pay | Admitting: Family Medicine

## 2023-04-26 ENCOUNTER — Ambulatory Visit: Payer: Medicare PPO | Admitting: Family Medicine

## 2023-04-26 VITALS — BP 122/80 | HR 68 | Temp 97.5°F | Ht 68.5 in | Wt 189.6 lb

## 2023-04-26 DIAGNOSIS — E782 Mixed hyperlipidemia: Secondary | ICD-10-CM | POA: Diagnosis not present

## 2023-04-26 DIAGNOSIS — R7301 Impaired fasting glucose: Secondary | ICD-10-CM | POA: Diagnosis not present

## 2023-04-26 DIAGNOSIS — I10 Essential (primary) hypertension: Secondary | ICD-10-CM

## 2023-04-26 DIAGNOSIS — I700 Atherosclerosis of aorta: Secondary | ICD-10-CM

## 2023-04-26 DIAGNOSIS — Z6828 Body mass index (BMI) 28.0-28.9, adult: Secondary | ICD-10-CM

## 2023-04-26 DIAGNOSIS — N401 Enlarged prostate with lower urinary tract symptoms: Secondary | ICD-10-CM | POA: Diagnosis not present

## 2023-04-26 NOTE — Assessment & Plan Note (Signed)
 Not overweight. Has muscle weight contributing.

## 2023-04-26 NOTE — Assessment & Plan Note (Signed)
 Recommend continue to work on eating healthy diet and exercise.

## 2023-04-26 NOTE — Assessment & Plan Note (Signed)
Well controlled.  No changes to medicines. Simvastatin 10 mg daily. Continue to work on eating a healthy diet and exercise.  Labs drawn today.

## 2023-04-26 NOTE — Assessment & Plan Note (Signed)
 Asymptomatic.  Had discussion about psa for prostate screening. Patient agreed at this time he does not wish to follow.

## 2023-04-26 NOTE — Assessment & Plan Note (Signed)
 At goal. Recommend continue to work on eating healthy diet and exercise. Continue amlodipine  2.5 mg once daily, valsartan /hydrochlorothiazide  320-25 mg daily.

## 2023-04-26 NOTE — Assessment & Plan Note (Signed)
Continue simvastatin 10 mg one before bed and aspirin 81 mg daily.

## 2023-04-27 LAB — COMPREHENSIVE METABOLIC PANEL WITH GFR
ALT: 35 IU/L (ref 0–44)
AST: 29 IU/L (ref 0–40)
Albumin: 4.4 g/dL (ref 3.8–4.8)
Alkaline Phosphatase: 102 IU/L (ref 44–121)
BUN/Creatinine Ratio: 18 (ref 10–24)
BUN: 15 mg/dL (ref 8–27)
Bilirubin Total: 0.9 mg/dL (ref 0.0–1.2)
CO2: 23 mmol/L (ref 20–29)
Calcium: 9.4 mg/dL (ref 8.6–10.2)
Chloride: 104 mmol/L (ref 96–106)
Creatinine, Ser: 0.83 mg/dL (ref 0.76–1.27)
Globulin, Total: 2 g/dL (ref 1.5–4.5)
Glucose: 103 mg/dL — ABNORMAL HIGH (ref 70–99)
Potassium: 4.1 mmol/L (ref 3.5–5.2)
Sodium: 143 mmol/L (ref 134–144)
Total Protein: 6.4 g/dL (ref 6.0–8.5)
eGFR: 91 mL/min/{1.73_m2} (ref >59)

## 2023-04-27 LAB — CBC WITH DIFFERENTIAL/PLATELET
Basophils Absolute: 0 10*3/uL (ref 0.0–0.2)
Basos: 1 %
EOS (ABSOLUTE): 0.1 10*3/uL (ref 0.0–0.4)
Eos: 3 %
Hematocrit: 45.6 % (ref 37.5–51.0)
Hemoglobin: 15.5 g/dL (ref 13.0–17.7)
Immature Grans (Abs): 0 10*3/uL (ref 0.0–0.1)
Immature Granulocytes: 0 %
Lymphocytes Absolute: 1.4 10*3/uL (ref 0.7–3.1)
Lymphs: 27 %
MCH: 30.6 pg (ref 26.6–33.0)
MCHC: 34 g/dL (ref 31.5–35.7)
MCV: 90 fL (ref 79–97)
Monocytes Absolute: 0.4 10*3/uL (ref 0.1–0.9)
Monocytes: 7 %
Neutrophils Absolute: 3.3 10*3/uL (ref 1.4–7.0)
Neutrophils: 62 %
Platelets: 201 10*3/uL (ref 150–450)
RBC: 5.07 x10E6/uL (ref 4.14–5.80)
RDW: 13 % (ref 11.6–15.4)
WBC: 5.3 10*3/uL (ref 3.4–10.8)

## 2023-04-27 LAB — LIPID PANEL
Chol/HDL Ratio: 3 ratio (ref 0.0–5.0)
Cholesterol, Total: 149 mg/dL (ref 100–199)
HDL: 50 mg/dL (ref >39)
LDL Chol Calc (NIH): 83 mg/dL (ref 0–99)
Triglycerides: 87 mg/dL (ref 0–149)
VLDL Cholesterol Cal: 16 mg/dL (ref 5–40)

## 2023-04-27 LAB — HEMOGLOBIN A1C
Est. average glucose Bld gHb Est-mCnc: 126 mg/dL
Hgb A1c MFr Bld: 6 % — ABNORMAL HIGH (ref 4.8–5.6)

## 2023-04-29 ENCOUNTER — Encounter: Payer: Self-pay | Admitting: Family Medicine

## 2023-04-30 DIAGNOSIS — M25662 Stiffness of left knee, not elsewhere classified: Secondary | ICD-10-CM | POA: Diagnosis not present

## 2023-04-30 DIAGNOSIS — M25562 Pain in left knee: Secondary | ICD-10-CM | POA: Diagnosis not present

## 2023-05-02 DIAGNOSIS — M25562 Pain in left knee: Secondary | ICD-10-CM | POA: Diagnosis not present

## 2023-05-02 DIAGNOSIS — M25662 Stiffness of left knee, not elsewhere classified: Secondary | ICD-10-CM | POA: Diagnosis not present

## 2023-05-10 ENCOUNTER — Other Ambulatory Visit: Payer: Self-pay | Admitting: Family Medicine

## 2023-05-10 DIAGNOSIS — S83242A Other tear of medial meniscus, current injury, left knee, initial encounter: Secondary | ICD-10-CM | POA: Diagnosis not present

## 2023-05-25 DIAGNOSIS — G8918 Other acute postprocedural pain: Secondary | ICD-10-CM | POA: Diagnosis not present

## 2023-05-25 DIAGNOSIS — S83242A Other tear of medial meniscus, current injury, left knee, initial encounter: Secondary | ICD-10-CM | POA: Diagnosis not present

## 2023-05-25 DIAGNOSIS — S83232A Complex tear of medial meniscus, current injury, left knee, initial encounter: Secondary | ICD-10-CM | POA: Diagnosis not present

## 2023-05-25 DIAGNOSIS — M2242 Chondromalacia patellae, left knee: Secondary | ICD-10-CM | POA: Diagnosis not present

## 2023-05-30 DIAGNOSIS — R2689 Other abnormalities of gait and mobility: Secondary | ICD-10-CM | POA: Diagnosis not present

## 2023-05-30 DIAGNOSIS — M25562 Pain in left knee: Secondary | ICD-10-CM | POA: Diagnosis not present

## 2023-05-30 DIAGNOSIS — M25662 Stiffness of left knee, not elsewhere classified: Secondary | ICD-10-CM | POA: Diagnosis not present

## 2023-10-25 ENCOUNTER — Ambulatory Visit: Admitting: Family Medicine

## 2023-10-25 ENCOUNTER — Encounter: Payer: Self-pay | Admitting: Family Medicine

## 2023-10-25 VITALS — BP 132/74 | HR 60 | Temp 98.0°F | Resp 18 | Ht 68.5 in | Wt 195.6 lb

## 2023-10-25 DIAGNOSIS — E782 Mixed hyperlipidemia: Secondary | ICD-10-CM | POA: Diagnosis not present

## 2023-10-25 DIAGNOSIS — Z23 Encounter for immunization: Secondary | ICD-10-CM | POA: Diagnosis not present

## 2023-10-25 DIAGNOSIS — I10 Essential (primary) hypertension: Secondary | ICD-10-CM

## 2023-10-25 DIAGNOSIS — R7301 Impaired fasting glucose: Secondary | ICD-10-CM | POA: Diagnosis not present

## 2023-10-25 DIAGNOSIS — Z0000 Encounter for general adult medical examination without abnormal findings: Secondary | ICD-10-CM

## 2023-10-25 LAB — POCT GLYCOSYLATED HEMOGLOBIN (HGB A1C): HbA1c POC (<> result, manual entry): 5.9 % (ref 4.0–5.6)

## 2023-10-25 LAB — POCT LIPID PANEL
HDL: 46
LDL: 80
Non-HDL: 96
TC: 142
TRG: 80

## 2023-10-25 NOTE — Assessment & Plan Note (Addendum)
 Hemoglobin A1c improved to 5.9, indicating good glucose control. No new symptoms. Orders:   POCT glycosylated hemoglobin (Hb A1C)

## 2023-10-25 NOTE — Progress Notes (Signed)
 Subjective:  Patient ID: Austin Atkins, male    DOB: 03/23/1945  Age: 78 y.o. MRN: [id number]  Chief Complaint  Patient presents with   Medical Management of Chronic Issues   HPI: Discussed the use of AI scribe software for clinical note transcription with the patient, who gave verbal consent to proceed.  History of Present Illness Austin Atkins is a 78 year old male who presents for his chronic visit.    General health status - No new health issues since last visit - No fevers, chills, sweats, or hot flashes  Cardiovascular and respiratory symptoms - No chest pain - No breathing difficulties - No leg swelling  Gastrointestinal and genitourinary symptoms - No abdominal pain  Otolaryngologic and ophthalmologic symptoms - No sore throat or stuffy nose - No difficulty with hearing or vision - Recent eye examination with no significant findings  Neurocognitive and functional status - No difficulty with concentration, memory, or decision-making - No difficulty with walking, climbing stairs, dressing, bathing, or doing errands alone - No issues with preparing food, eating, using the toilet, managing medications, finances, or housekeeping  Mood and psychosocial status - No feelings of depression, sadness, or loss of interest in activities - No financial difficulties, intimate partner violence, or transportation issues - Regular exercise and frequent socialization with family, friends, and neighbors - No participation in church or clubs  Lifestyle and preventive health measures - Walks approximately three miles almost every day, taking about an hour - Diet described as 'fair' and follows the 'eighty-twenty rule' for healthy eating - Uses sunscreen when outside for extended periods  Home and personal safety - Home safety measures in place, including handrails on stairs, raised toilet, and shower seat - Wears seatbelt - Functional smoke and carbon monoxide detectors -  Practices gun safety   Hypertension: Taking amlodipine  2.5 mg daily, Valsartan -HCTZ 320-25 mg daily.  Hyperllpidemia with atherosclerosis: Currently on simvastatin  10 mg daily and aspirin 81 mg daily. Last lipids were at [facility]   Prediabetic: Last A1C 6.0%, eating healthy.  Eating healthy and exercises.     10/25/2023    7:39 AM 04/26/2023    8:03 AM 10/26/2022    8:08 AM 05/02/2022    8:28 AM 10/24/2021   11:17 AM  Depression screen PHQ 2/9  Decreased Interest 0 0 0 0 0  Down, Depressed, Hopeless 0 0  0 0  PHQ - 2 Score 0 0 0 0 0  Altered sleeping 0      Tired, decreased energy 0      Change in appetite 0      Feeling bad or failure about yourself  0      Trouble concentrating 0      Moving slowly or fidgety/restless 0      Suicidal thoughts 0      PHQ-9 Score 0      Difficult doing work/chores Not difficult at all            10/25/2023    7:38 AM  Fall Risk   Falls in the past year? 0  Number falls in past yr: 0  Injury with Fall? 0  Risk for fall due to : No Fall Risks  Follow up Falls evaluation completed    Patient Care Team: Sherre Clapper, MD as PCP - General (Family Medicine) Delores Lauraine NOVAK, [facility] (Inactive) as Pharmacist (Pharmacist)   Review of Systems  Constitutional:  Negative for chills, fatigue and fever.  HENT:  Negative  for congestion, ear pain and sore throat.   Respiratory:  Negative for cough and shortness of breath.   Cardiovascular:  Negative for chest pain.  Gastrointestinal:  Negative for abdominal pain, constipation, diarrhea, nausea and vomiting.  Endocrine: Negative for polydipsia, polyphagia and polyuria.  Genitourinary:  Negative for dysuria and frequency.  Musculoskeletal:  Negative for arthralgias and myalgias.  Neurological:  Negative for dizziness and headaches.  Psychiatric/Behavioral:  Negative for dysphoric mood.        No dysphoria    Current Outpatient Medications on File Prior to Visit  Medication Sig Dispense Refill    amLODipine  (NORVASC ) 2.5 MG tablet TAKE 1 TABLET EVERY DAY 90 tablet 3   Ascorbic Acid  (VITAMIN C) 1000 MG tablet Take 1,000 mg by mouth daily.     aspirin EC 81 MG tablet Take 81 mg by mouth daily.     Calcium-Magnesium-Vitamin D (CALCIUM 1200+D3 PO) Take 1 tablet by mouth daily.     simvastatin  (ZOCOR ) 10 MG tablet TAKE 1 TABLET AT BEDTIME 90 tablet 3   valsartan -hydrochlorothiazide  (DIOVAN -HCT) 320-25 MG tablet TAKE 1 TABLET EVERY DAY 90 tablet 3   No current facility-administered medications on file prior to visit.   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   Other acute pancreatitis without necrosis or infection    Prediabetes    Past Surgical History:  Procedure Laterality Date   HERNIA REPAIR     TRANSFORAMINAL LUMBAR INTERBODY FUSION (TLIF) WITH PEDICLE SCREW FIXATION 2 LEVEL Right 05/25/2021   Procedure: RIGHT-SIDED LUMBAR 3- LUMBAR 4, LUMBAR 4- LUMBAR 5 TRANSFORAMINAL LUMBAR INTERBODY FUSION AND DECOMPRESSION WITH INSTRUMENTATION AND ALLOGRAFT;  Surgeon: Beuford Anes, MD;  Location: [facility] OR;  Service: Orthopedics;  Laterality: Right;    Family History  Problem Relation Age of Onset   Cancer Mother    Heart failure Father    Kidney Stones Father    Hypertension Other    Cancer Other    Social History   Socioeconomic History   Marital status: Married    Spouse name: Jori   Number of children: 1   Years of education: Not on file   Highest education level: Not on file  Occupational History   Occupation: Magazine Features Editor: [employer]  Tobacco Use   Smoking status: Former     Current packs/day: 0.00    Types: Cigarettes    Quit date: 1980    Years since quitting: 45.8   Smokeless tobacco: Never  Vaping Use   Vaping status: Never Used  Substance and Sexual Activity   Alcohol use: Not Currently    Alcohol/week: 2.0 standard drinks of alcohol    Types: 1 Glasses of wine, 1 Cans of beer per week   Drug use: Never   Sexual activity: Not Currently  Other Topics Concern   Not on file  Social History Narrative   Not on file   Social Drivers of Health   Financial Resource Strain: Low Risk  (10/29/2023)   Overall Financial Resource Strain (CARDIA)    Difficulty of Paying Living Expenses: Not  hard at all  Food Insecurity: No Food Insecurity (10/29/2023)   Hunger Vital Sign    Worried About Running Out of Food in the Last Year: Never true    Ran Out of Food in the Last Year: Never true  Transportation Needs: No Transportation Needs (10/29/2023)   PRAPARE - Administrator, Civil Service (Medical): No    Lack of Transportation (Non-Medical): No  Physical Activity: Sufficiently Active (10/29/2023)   Exercise Vital Sign    Days of Exercise per Week: 4 days    Minutes of Exercise per Session: 60 min  Stress: No Stress Concern Present (10/29/2023)   [employer] of Occupational Health - Occupational Stress Questionnaire    Feeling of Stress: Not at all  Social Connections: Moderately Isolated (10/29/2023)   Social Connection and Isolation Panel    Frequency of Communication with Friends and Family: More than three times a week    Frequency of Social Gatherings with Friends and Family: More than three times a week    Attends Religious Services: Never    Database Administrator or Organizations: No    Attends Engineer, Structural: Never    Marital Status: Married    Objective:  BP 132/74   Pulse 60   Temp 98 F (36.7 C) (Temporal)   Resp 18   Ht 5' 8.5 (1.74 m)   Wt 195 lb 9.6 oz (88.7 kg)   SpO2 98%   BMI 29.31 kg/m       10/29/2023   10:04 AM 10/25/2023    7:36 AM 04/26/2023    8:00 AM  BP/Weight  Systolic BP  132 877  Diastolic BP  74 80  Wt. (Lbs) 195 195.6 189.6  BMI 31.67 kg/m2 29.31 kg/m2 28.41 kg/m2    Physical Exam Vitals reviewed.  Constitutional:      Appearance: Normal appearance.  HENT:     Right Ear: Tympanic membrane, ear canal and external ear normal.     Left Ear: Tympanic membrane, ear canal and external ear normal.     Nose: Nose normal. No congestion or rhinorrhea.     Mouth/Throat:     Mouth: Mucous membranes are moist.     Pharynx: No oropharyngeal exudate or posterior oropharyngeal erythema.  Neck:     Vascular: No carotid bruit.  Cardiovascular:     Rate and Rhythm: Normal rate and regular rhythm.     Pulses: Normal pulses.     Heart sounds: Normal heart sounds.  Pulmonary:     Effort: Pulmonary effort is normal. No respiratory distress.     Breath sounds: Normal breath sounds. No wheezing, rhonchi or rales.  Abdominal:     General: Bowel sounds are normal.     Palpations: Abdomen is soft.     Tenderness: There is no abdominal tenderness.  Lymphadenopathy:     Cervical: No cervical adenopathy.  Neurological:     Mental Status: He is alert and oriented to person, place, and time.  Psychiatric:        Mood and Affect: Mood normal.        Behavior: Behavior normal.         Lab Results  Component Value Date   WBC 5.3 04/26/2023   HGB 15.5 04/26/2023   HCT 45.6 04/26/2023   PLT 201 04/26/2023   GLUCOSE 103 (H) 04/26/2023   CHOL 149 04/26/2023   TRIG 87 04/26/2023   HDL 50 04/26/2023   LDLCALC 83 04/26/2023  ALT 35 04/26/2023   AST 29 04/26/2023   NA 143 04/26/2023   K 4.1 04/26/2023   CL 104 04/26/2023   CREATININE 0.83 04/26/2023   BUN 15 04/26/2023   CO2 23 04/26/2023   TSH 2.650 08/09/2021   HGBA1C 5.9 10/25/2023    Results for orders placed or performed in visit on 10/25/23  POCT Lipid Panel   Collection Time: 10/25/23  7:55 AM  Result  Value Ref Range   TC 142    HDL 46    TRG 80    LDL 80    Non-HDL 96    TC/HDL    POCT glycosylated hemoglobin (Hb A1C)   Collection Time: 10/25/23  7:56 AM  Result Value Ref Range   Hemoglobin A1C     HbA1c POC (<> result, manual entry) 5.9 4.0 - 5.6 %   HbA1c, POC (prediabetic range)     HbA1c, POC (controlled diabetic range)    .  Assessment & Plan:   Assessment & Plan Essential hypertension, benign Blood pressure well-controlled with current medication regimen. No chest pain or dyspnea reported. Continue amlodipine  2.5 mg daily, Valsartan -HCTZ 320-25 mg daily.    Impaired fasting glucose Hemoglobin A1c improved to 5.9, indicating good glucose control. No new symptoms. Orders:   POCT glycosylated hemoglobin (Hb A1C)  Mixed hyperlipidemia Cholesterol levels well-managed with simvastatin . Currently on simvastatin  10 mg daily and aspirin 81 mg daily. Orders:   POCT Lipid Panel  Encounter for immunization  Orders:   Flu vaccine HIGH DOSE PF(Fluzone Trivalent)  Encounter for immunization  Orders:   Pfizer Comirnaty Covid-19 Vaccine 68yrs & older   Body mass index is 29.31 kg/m.   No orders of the defined types were placed in this encounter.   Orders Placed This Encounter  Procedures   Flu vaccine HIGH DOSE PF(Fluzone Trivalent)   Pfizer Comirnaty Covid-19 Vaccine 74yrs & older   POCT glycosylated hemoglobin (Hb A1C)   POCT Lipid Panel    I,Marla I Leal-Borjas,acting as a scribe for Abigail Free, MD.,have documented all relevant documentation on the behalf of Abigail Free, MD,as directed by  Abigail Free, MD while in the presence of Abigail Free, MD.   Follow-up: Return in about 6 months (around 04/24/2024) for chronic follow up.  An After Visit Summary was printed and given to the patient.  I attest that I have reviewed this visit and agree with the plan scribed by my staff.   Abigail Free, MD [facility] [phone]

## 2023-10-25 NOTE — Assessment & Plan Note (Addendum)
 Cholesterol levels well-managed with simvastatin . Orders:   POCT Lipid Panel

## 2023-10-25 NOTE — Assessment & Plan Note (Addendum)
 Blood pressure well-controlled with current medication regimen. No chest pain or dyspnea reported. Continue amlodipine  2.5 mg daily, Valsartan -HCTZ 320-25 mg daily.

## 2023-10-27 NOTE — Patient Instructions (Signed)
 Health Maintenance, Male  Adopting a healthy lifestyle and getting preventive care are important in promoting health and wellness. Ask your health care provider about:  The right schedule for you to have regular tests and exams.  Things you can do on your own to prevent diseases and keep yourself healthy.  What should I know about diet, weight, and exercise?  Eat a healthy diet    Eat a diet that includes plenty of vegetables, fruits, low-fat dairy products, and lean protein.  Do not eat a lot of foods that are high in solid fats, added sugars, or sodium.  Maintain a healthy weight  Body mass index (BMI) is a measurement that can be used to identify possible weight problems. It estimates body fat based on height and weight. Your health care provider can help determine your BMI and help you achieve or maintain a healthy weight.  Get regular exercise  Get regular exercise. This is one of the most important things you can do for your health. Most adults should:  Exercise for at least 150 minutes each week. The exercise should increase your heart rate and make you sweat (moderate-intensity exercise).  Do strengthening exercises at least twice a week. This is in addition to the moderate-intensity exercise.  Spend less time sitting. Even light physical activity can be beneficial.  Watch cholesterol and blood lipids  Have your blood tested for lipids and cholesterol at 76 years of age, then have this test every 5 years.  You may need to have your cholesterol levels checked more often if:  Your lipid or cholesterol levels are high.  You are older than 76 years of age.  You are at high risk for heart disease.  What should I know about cancer screening?  Many types of cancers can be detected early and may often be prevented. Depending on your health history and family history, you may need to have cancer screening at various ages. This may include screening for:  Colorectal cancer.  Prostate cancer.  Skin cancer.  Lung  cancer.  What should I know about heart disease, diabetes, and high blood pressure?  Blood pressure and heart disease  High blood pressure causes heart disease and increases the risk of stroke. This is more likely to develop in people who have high blood pressure readings or are overweight.  Talk with your health care provider about your target blood pressure readings.  Have your blood pressure checked:  Every 3-5 years if you are 24-52 years of age.  Every year if you are 3 years old or older.  If you are between the ages of 60 and 72 and are a current or former smoker, ask your health care provider if you should have a one-time screening for abdominal aortic aneurysm (AAA).  Diabetes  Have regular diabetes screenings. This checks your fasting blood sugar level. Have the screening done:  Once every three years after age 66 if you are at a normal weight and have a low risk for diabetes.  More often and at a younger age if you are overweight or have a high risk for diabetes.  What should I know about preventing infection?  Hepatitis B  If you have a higher risk for hepatitis B, you should be screened for this virus. Talk with your health care provider to find out if you are at risk for hepatitis B infection.  Hepatitis C  Blood testing is recommended for:  Everyone born from [birth year] through 1963.  Anyone  with known risk factors for hepatitis C.  Sexually transmitted infections (STIs)  You should be screened each year for STIs, including gonorrhea and chlamydia, if:  You are sexually active and are younger than 76 years of age.  You are older than 76 years of age and your health care provider tells you that you are at risk for this type of infection.  Your sexual activity has changed since you were last screened, and you are at increased risk for chlamydia or gonorrhea. Ask your health care provider if you are at risk.  Ask your health care provider about whether you are at high risk for HIV. Your health care provider  may recommend a prescription medicine to help prevent HIV infection. If you choose to take medicine to prevent HIV, you should first get tested for HIV. You should then be tested every 3 months for as long as you are taking the medicine.  Follow these instructions at home:  Alcohol use  Do not drink alcohol if your health care provider tells you not to drink.  If you drink alcohol:  Limit how much you have to 0-2 drinks a day.  Know how much alcohol is in your drink. In the [place] one drink equals one 12 oz bottle of beer (355 mL), one 5 oz glass of wine (148 mL), or one 1 oz glass of hard liquor (44 mL).  Lifestyle  Do not use any products that contain nicotine or tobacco. These products include cigarettes, chewing tobacco, and vaping devices, such as e-cigarettes. If you need help quitting, ask your health care provider.  Do not use street drugs.  Do not share needles.  Ask your health care provider for help if you need support or information about quitting drugs.  General instructions  Schedule regular health, dental, and eye exams.  Stay current with your vaccines.  Tell your health care provider if:  You often feel depressed.  You have ever been abused or do not feel safe at home.  Summary  Adopting a healthy lifestyle and getting preventive care are important in promoting health and wellness.  Follow your health care provider's instructions about healthy diet, exercising, and getting tested or screened for diseases.  Follow your health care provider's instructions on monitoring your cholesterol and blood pressure.  This information is not intended to replace advice given to you by your health care provider. Make sure you discuss any questions you have with your health care provider.  Document Revised: 05/10/2020 Document Reviewed: 05/10/2020  Elsevier Patient Education  2024 [employer].

## 2023-10-27 NOTE — Progress Notes (Unsigned)
 Subjective:   Austin Atkins is a 78 y.o. male who presents for Medicare Annual/Subsequent preventive examination.  Visit Complete: In person  Patient Medicare AWV questionnaire was completed by the patient on 10/26/2023; I have confirmed that all information answered by patient is correct and no changes since this date.  Neurocognitive and functional status - No difficulty with concentration, memory, or decision-making - No difficulty with walking, climbing stairs, dressing, bathing, or doing errands alone - No issues with preparing food, eating, using the toilet, managing medications, finances, or housekeeping  Mood and psychosocial status - No feelings of depression, sadness, or loss of interest in activities - No financial difficulties, intimate partner violence, or transportation issues - Regular exercise and frequent socialization with family, friends, and neighbors - No participation in church or clubs  Lifestyle and preventive health measures - Walks approximately three miles almost every day, taking about an hour - Diet described as 'fair' and follows the 'eighty-twenty rule' for healthy eating - Uses sunscreen when outside for extended periods  Home and personal safety - Home safety measures in place, including handrails on stairs, raised toilet, and shower seat - Wears seatbelt - Functional smoke and carbon monoxide detectors - Practices gun safety       Objective:    Today's Vitals   10/29/23 1004 10/31/23 2242  Weight: 195 lb (88.5 kg)   Height: 5' 5.8 (1.671 m)   PainSc:  0-No pain   Body mass index is 31.67 kg/m.     10/25/2023    8:02 AM 10/26/2022    8:07 AM 10/24/2021   11:25 AM 05/18/2021   10:21 AM 10/21/2020    3:53 PM 03/10/2019    8:49 AM  Advanced Directives  Does Patient Have a Medical Advance Directive? Yes Yes Yes No Yes Yes  Type of Estate Agent of [place];Living will Healthcare Power of [place];Living will  Healthcare Power of [place];Living will  Healthcare Power of [place];Living will Healthcare Power of [place];Living will  Does patient want to make changes to medical advance directive?  No - Patient declined   No - Patient declined   Copy of Healthcare Power of Attorney in Chart? No - copy requested No - copy requested   No - copy requested   Would patient like information on creating a medical advance directive?    No - Patient declined      Current Medications (verified) Outpatient Encounter Medications as of 10/29/2023  Medication Sig   amLODipine  (NORVASC ) 2.5 MG tablet TAKE 1 TABLET EVERY DAY   Ascorbic Acid  (VITAMIN C) 1000 MG tablet Take 1,000 mg by mouth daily.   aspirin EC 81 MG tablet Take 81 mg by mouth daily.   Calcium-Magnesium-Vitamin D (CALCIUM 1200+D3 PO) Take 1 tablet by mouth daily.   simvastatin  (ZOCOR ) 10 MG tablet TAKE 1 TABLET AT BEDTIME   valsartan -hydrochlorothiazide  (DIOVAN -HCT) 320-25 MG tablet TAKE 1 TABLET EVERY DAY   No facility-administered encounter medications on file as of 10/29/2023.    Allergies (verified) Patient has no known allergies.   History: Past Medical History:  Diagnosis Date   Arthritis    Atherosclerosis of aorta    Avascular necrosis of bone of right hip (HCC) 03/01/2021   Avascular necrosis of bones of both hips (HCC) 12/11/2018   BPPV (benign paroxysmal positional vertigo) 09/26/2021   CAD (coronary artery disease)    Erectile dysfunction    High blood pressure    High cholesterol    Osteoarthritis of  lumbar spine with myelopathy 03/01/2021   Other acute pancreatitis without necrosis or infection    Prediabetes    Past Surgical History:  Procedure Laterality Date   HERNIA REPAIR     TRANSFORAMINAL LUMBAR INTERBODY FUSION (TLIF) WITH PEDICLE SCREW FIXATION 2 LEVEL Right 05/25/2021   Procedure: RIGHT-SIDED LUMBAR 3- LUMBAR 4, LUMBAR 4- LUMBAR 5 TRANSFORAMINAL LUMBAR INTERBODY FUSION AND DECOMPRESSION WITH INSTRUMENTATION AND  ALLOGRAFT;  Surgeon: Beuford Anes, MD;  Location: [facility] OR;  Service: Orthopedics;  Laterality: Right;   Family History  Problem Relation Age of Onset   Cancer Mother    Heart failure Father    Kidney Stones Father    Hypertension Other    Cancer Other    Social History   Socioeconomic History   Marital status: Married    Spouse name: Jori   Number of children: 1   Years of education: Not on file   Highest education level: Not on file  Occupational History   Occupation: Magazine Features Editor: [employer]  Tobacco Use   Smoking status: Former    Current packs/day: 0.00    Types: Cigarettes    Quit date: 1980    Years since quitting: 45.8   Smokeless tobacco: Never  Vaping Use   Vaping status: Never Used  Substance and Sexual Activity   Alcohol use: Not Currently    Alcohol/week: 2.0 standard drinks of alcohol    Types: 1 Glasses of wine, 1 Cans of beer per week   Drug use: Never   Sexual activity: Not Currently  Other Topics Concern   Not on file  Social History Narrative   Not on file   Social Drivers of Health   Financial Resource Strain: Low Risk  (10/29/2023)   Overall Financial Resource Strain (CARDIA)    Difficulty of Paying Living Expenses: Not hard at all  Food Insecurity: No Food Insecurity (10/29/2023)   Hunger Vital Sign    Worried About Running Out of Food in the Last Year: Never true    Ran Out of Food in the Last Year: Never true  Transportation Needs: No Transportation Needs (10/29/2023)   PRAPARE - Administrator, Civil Service (Medical): No    Lack of Transportation (Non-Medical): No  Physical Activity: Sufficiently Active (10/29/2023)   Exercise Vital Sign    Days of Exercise per Week: 4 days    Minutes of Exercise per Session: 60 min  Stress: No Stress Concern Present (10/29/2023)   [employer] of Occupational Health - Occupational Stress Questionnaire    Feeling of Stress: Not at all  Social Connections:  Moderately Isolated (10/29/2023)   Social Connection and Isolation Panel    Frequency of Communication with Friends and Family: More than three times a week    Frequency of Social Gatherings with Friends and Family: More than three times a week    Attends Religious Services: Never    Database Administrator or Organizations: No    Attends Engineer, Structural: Never    Marital Status: Married    Tobacco Counseling Counseling given: Not Answered   Clinical Intake:  Pre-visit preparation completed: Yes  Pain : No/denies pain Pain Score: 0-No pain     Nutritional Status: BMI > 30  Obese Nutritional Risks: None Diabetes: No  How often do you need to have someone help you when you read instructions, pamphlets, or other written materials from your doctor or pharmacy?: 1 - Never  Activities of Daily Living    10/25/2023    7:59 AM  In your present state of health, do you have any difficulty performing the following activities:  Hearing? 0  Vision? 0  Difficulty concentrating or making decisions? 0  Walking or climbing stairs? 0  Dressing or bathing? 0  Doing errands, shopping? 0  Preparing Food and eating ? N  Using the Toilet? N  In the past six months, have you accidently leaked urine? N  Do you have problems with loss of bowel control? N  Managing your Medications? N  Managing your Finances? N  Housekeeping or managing your Housekeeping? N    Patient Care Team: Sherre Clapper, MD as PCP - General (Family Medicine) Delores Lauraine NOVAK, [facility] (Inactive) as Pharmacist (Pharmacist)  Indicate any recent Medical Services you may have received from other than [facility] providers in the past year (date may be approximate).     Assessment:   This is a routine wellness examination for Austin Atkins.  Hearing/Vision screen No results found.  Depression Screen    10/25/2023    7:39 AM 04/26/2023    8:03 AM 10/26/2022    8:08 AM 05/02/2022    8:28 AM 10/24/2021   11:17  AM 08/09/2021    4:10 PM 10/21/2020    3:52 PM  PHQ 2/9 Scores  PHQ - 2 Score 0 0 0 0 0 0 0  PHQ- 9 Score 0          Fall Risk    10/25/2023    7:38 AM 04/26/2023    8:03 AM 10/26/2022    8:08 AM 05/02/2022    8:28 AM 10/24/2021   11:18 AM  Fall Risk   Falls in the past year? 0 0 0 0 0  Number falls in past yr: 0 0 0 0 0  Injury with Fall? 0 0 0 0 0  Risk for fall due to : No Fall Risks No Fall Risks No Fall Risks No Fall Risks No Fall Risks  Follow up Falls evaluation completed  Falls evaluation completed Falls evaluation completed Falls evaluation completed      Data saved with a previous flowsheet row definition    MEDICARE RISK AT HOME: Medicare Risk at Home Any stairs in or around the home?: Yes If so, are there any without handrails?: No Home free of loose throw rugs in walkways, pet beds, electrical cords, etc?: Yes Adequate lighting in your home to reduce risk of falls?: Yes Life alert?: No Use of a cane, walker or w/c?: Yes Grab bars in the bathroom?: Yes Shower chair or bench in shower?: Yes Elevated toilet seat or a handicapped toilet?: Yes  TIMED UP AND GO:  Was the test performed?  Yes  Length of time to ambulate 10 feet: 3 sec Gait steady and fast without use of assistive device    Cognitive Function:        10/25/2023    8:03 AM 10/21/2020    3:55 PM  6CIT Screen  What Year? 0 points 0 points  What month? 0 points 0 points  What time? 0 points 0 points  Count back from 20 0 points 0 points  Months in reverse 0 points 0 points  Repeat phrase 0 points 0 points  Total Score 0 points 0 points    Immunizations Immunization History  Administered Date(s) Administered   Fluad Quad(high Dose 65+) 09/11/2018, 10/06/2019, 10/21/2020, 09/26/2021   Fluad Trivalent(High Dose 65+) 10/26/2022   INFLUENZA, HIGH  DOSE SEASONAL PF 10/25/2023   Influenza-Unspecified 10/02/2017   Moderna Covid-19 Vaccine Bivalent Booster 58yrs & up 12/15/2020   Moderna  SARS-COV2 Booster Vaccination 11/04/2019, 07/13/2020   Moderna Sars-Covid-2 Vaccination 01/23/2019, 02/26/2019   Pfizer(Comirnaty)Fall Seasonal Vaccine 12 years and older 09/26/2021, 10/26/2022, 10/25/2023   Pneumococcal Conjugate-13 08/15/2016   Pneumococcal Polysaccharide-23 12/07/2010, 10/21/2020   Td 12/07/2010   Tdap 10/31/2021   Zoster Recombinant(Shingrix) 09/11/2018, 11/20/2018    TDAP status: Up to date  Flu Vaccine status: Up to date  Pneumococcal vaccine status: Up to date  Covid-19 vaccine status: Declined, Education has been provided regarding the importance of this vaccine but patient still declined. Advised may receive this vaccine at local pharmacy or Health Dept.or vaccine clinic. Aware to provide a copy of the vaccination record if obtained from local pharmacy or Health Dept. Verbalized acceptance and understanding.  Qualifies for Shingles Vaccine? Yes   Zostavax completed Yes   Shingrix Completed?: Yes  Screening Tests Health Maintenance  Topic Date Due   COVID-19 Vaccine (9 - 2025-26 season) 04/24/2024   Medicare Annual Wellness (AWV)  10/28/2024   Colonoscopy  05/17/2028   DTaP/Tdap/Td (3 - Td or Tdap) 11/01/2031   Pneumococcal Vaccine: 50+ Years  Completed   Influenza Vaccine  Completed   Hepatitis C Screening  Completed   Zoster Vaccines- Shingrix  Completed   Meningococcal B Vaccine  Aged Out    Health Maintenance  There are no preventive care reminders to display for this patient.  Colorectal cancer screening: Type of screening: Colonoscopy. Completed 05/17/2021. Repeat every 0 years  Lung Cancer Screening: (Low Dose CT Chest recommended if Age 94-80 years, 20 pack-year currently smoking OR have quit w/in 15years.) does not qualify.    Additional Screening:  Hepatitis C Screening: does not qualify; Completed 10/06/2019  Vision Screening: Recommended annual ophthalmology exams for early detection of glaucoma and other disorders of the eye. Is  the patient up to date with their annual eye exam?  Yes    Dental Screening: Recommended annual dental exams for proper oral hygiene     Plan:     I have personally reviewed and noted the following in the patient's chart:   Medical and social history Use of alcohol, tobacco or illicit drugs  Current medications and supplements including opioid prescriptions. Patient is not currently taking opioid prescriptions. Functional ability and status Nutritional status Physical activity Advanced directives List of other physicians Hospitalizations, surgeries, and ER visits in previous 12 months Vitals Screenings to include cognitive, depression, and falls Referrals and appointments  In addition, I have reviewed and discussed with patient certain preventive protocols, quality metrics, and best practice recommendations. A written personalized care plan for preventive services as well as general preventive health recommendations were provided to patient.     Abigail Free, MD   10/31/2023   After Visit Summary: (In Person-Printed) AVS printed and given to the patient

## 2023-10-29 ENCOUNTER — Encounter: Payer: Self-pay | Admitting: Family Medicine

## 2023-10-29 ENCOUNTER — Encounter: Payer: Medicare PPO | Admitting: Family Medicine

## 2023-10-29 ENCOUNTER — Ambulatory Visit (INDEPENDENT_AMBULATORY_CARE_PROVIDER_SITE_OTHER): Admitting: Family Medicine

## 2023-10-29 VITALS — Ht 65.8 in | Wt 195.0 lb

## 2023-10-29 DIAGNOSIS — Z0000 Encounter for general adult medical examination without abnormal findings: Secondary | ICD-10-CM

## 2023-10-30 DIAGNOSIS — Z8249 Family history of ischemic heart disease and other diseases of the circulatory system: Secondary | ICD-10-CM | POA: Diagnosis not present

## 2023-10-30 DIAGNOSIS — Z7982 Long term (current) use of aspirin: Secondary | ICD-10-CM | POA: Diagnosis not present

## 2023-10-30 DIAGNOSIS — E785 Hyperlipidemia, unspecified: Secondary | ICD-10-CM | POA: Diagnosis not present

## 2023-10-30 DIAGNOSIS — Z809 Family history of malignant neoplasm, unspecified: Secondary | ICD-10-CM | POA: Diagnosis not present

## 2023-10-30 DIAGNOSIS — I129 Hypertensive chronic kidney disease with stage 1 through stage 4 chronic kidney disease, or unspecified chronic kidney disease: Secondary | ICD-10-CM | POA: Diagnosis not present

## 2023-10-30 DIAGNOSIS — H269 Unspecified cataract: Secondary | ICD-10-CM | POA: Diagnosis not present

## 2023-10-30 DIAGNOSIS — I739 Peripheral vascular disease, unspecified: Secondary | ICD-10-CM | POA: Diagnosis not present

## 2023-10-30 DIAGNOSIS — N181 Chronic kidney disease, stage 1: Secondary | ICD-10-CM | POA: Diagnosis not present

## 2023-10-30 DIAGNOSIS — I2510 Atherosclerotic heart disease of native coronary artery without angina pectoris: Secondary | ICD-10-CM | POA: Diagnosis not present

## 2023-10-31 ENCOUNTER — Encounter: Payer: Self-pay | Admitting: Family Medicine

## 2023-12-04 ENCOUNTER — Other Ambulatory Visit: Payer: Self-pay

## 2023-12-04 MED ORDER — ALPRAZOLAM 1 MG PO TABS: 1.5000 mg | ORAL_TABLET | Freq: Two times a day (BID) | ORAL | 5 refills | Status: AC | PRN

## 2023-12-05 DIAGNOSIS — L814 Other melanin hyperpigmentation: Secondary | ICD-10-CM | POA: Diagnosis not present

## 2023-12-05 DIAGNOSIS — L570 Actinic keratosis: Secondary | ICD-10-CM | POA: Diagnosis not present

## 2023-12-05 DIAGNOSIS — B079 Viral wart, unspecified: Secondary | ICD-10-CM | POA: Diagnosis not present

## 2023-12-05 DIAGNOSIS — L578 Other skin changes due to chronic exposure to nonionizing radiation: Secondary | ICD-10-CM | POA: Diagnosis not present

## 2023-12-05 DIAGNOSIS — D225 Melanocytic nevi of trunk: Secondary | ICD-10-CM | POA: Diagnosis not present

## 2023-12-14 ENCOUNTER — Other Ambulatory Visit: Payer: Self-pay | Admitting: Family Medicine

## 2024-01-07 ENCOUNTER — Ambulatory Visit: Payer: Self-pay

## 2024-01-07 NOTE — Telephone Encounter (Signed)
 FYI Only or Action Required?: FYI only for provider: appointment scheduled on 01/08/2024.  Patient was last seen in primary care on 10/29/2023 by Sherre Clapper, MD.  Called Nurse Triage reporting Cough.  Symptoms began several weeks ago.  Interventions attempted: Prescription medications: Zpak, prednisone .  Symptoms are: gradually worsening.  Triage Disposition: See Physician Within 24 Hours  Patient/caregiver understands and will follow disposition?:    Reason for Disposition  [1] Fever returns after gone for over 24 hours AND [2] symptoms worse or not improved  Answer Assessment - Initial Assessment Questions 12/24 UC sinus drainage neg for strep, flu, COVID. Steroid shot given. Never got better. Developed cough went to UC again 1/3 zpak, prednisone . Zpak finishes tomorrow. Tylenol  for fever. drainage on eyes, burning in right eye. Sore throat has resolved. 1. ONSET: When did the cough begin?      12/26/2023 2. SEVERITY: How bad is the cough today?      Cough is worsening 3. SPUTUM: Describe the color of your sputum (e.g., none, dry cough; clear, white, yellow, green)     Light colored 4. HEMOPTYSIS: Are you coughing up any blood? If Yes, ask: How much? (e.g., flecks, streaks, tablespoons, etc.)     Denies 5. DIFFICULTY BREATHING: Are you having difficulty breathing? If Yes, ask: How bad is it? (e.g., mild, moderate, severe)      Denies 6. FEVER: Do you have a fever? If Yes, ask: What is your temperature, how was it measured, and when did it start?     99.4 97.9-98 usually 7. CARDIAC HISTORY: Do you have any history of heart disease? (e.g., heart attack, congestive heart failure)      HTN 8. LUNG HISTORY: Do you have any history of lung disease?  (e.g., pulmonary embolus, asthma, emphysema)     Denies 9. PE RISK FACTORS: Do you have a history of blood clots? (or: recent major surgery, recent prolonged travel, bedridden)     Denies 10. OTHER SYMPTOMS:  Do you have any other symptoms? (e.g., runny nose, wheezing, chest pain)       Some chest pain that he gets when he gets a bad coughing fit and subsides when cough does. 12. TRAVEL: Have you traveled out of the country in the last month? (e.g., travel history, exposures)       Denies  Protocols used: Cough - Acute Productive-A-AH

## 2024-01-08 ENCOUNTER — Ambulatory Visit: Admitting: Physician Assistant

## 2024-01-08 VITALS — BP 138/72 | HR 68 | Temp 97.8°F | Ht 65.8 in | Wt 193.0 lb

## 2024-01-08 DIAGNOSIS — J060 Acute laryngopharyngitis: Secondary | ICD-10-CM | POA: Diagnosis not present

## 2024-01-08 MED ORDER — PREDNISONE 20 MG PO TABS: ORAL_TABLET | ORAL | 0 refills | Status: AC

## 2024-01-08 MED ORDER — GUAIFENESIN-CODEINE 100-10 MG/5ML PO SOLN: 5.0000 mL | Freq: Three times a day (TID) | ORAL | 0 refills | Status: DC | PRN

## 2024-01-08 MED ORDER — DOXYCYCLINE HYCLATE 100 MG PO TABS: 100.0000 mg | ORAL_TABLET | Freq: Two times a day (BID) | ORAL | 0 refills | Status: DC

## 2024-01-08 NOTE — Progress Notes (Signed)
 "  Acute Office Visit  Subjective:    Patient ID: Austin Atkins, male    DOB: [birth year], 79 y.o.   MRN: [id number]  Chief Complaint  Patient presents with   Cough    HPI: Patient is in today for cough, sinus congestion  Discussed the use of AI scribe software for clinical note transcription with the patient, who gave verbal consent to proceed.  History of Present Illness Austin Atkins is a 79 year old male who presents with sinus congestion and worsening cough since Christmas.  He has been experiencing sinus congestion and a worsening cough since Christmas. The cough, described as sometimes 'brutal', does not follow a specific pattern of worsening at night or during the day. It is generally dry with occasional productive episodes, producing sputum that is not dark green. He has experienced a slight fever over the past two days, reaching up to 99.64F. No vomiting or stomach pain, but he notes pain in his stomach when coughing. The cough is sometimes prolonged but not consistently so, and he denies feeling like he will cough to the point of vomiting.  He sought treatment at urgent care, where he received a steroid shot, a Kenalog  pack, a Z-Pak, prednisone , and cough syrup, but these treatments have not alleviated his symptoms. No imaging studies, such as x-rays, have been performed. He denies any history of acid reflux or experiencing burning or abnormal taste in the morning.  Recently, he has noticed drainage into his right eye, causing it to 'mat up' during the night, requiring a warm washcloth to clean it. This has led to blurred vision and a burning sensation in the right eye, which started a few days ago. The left eye is not significantly affected.  He has a history of smoking but currently only smokes occasionally. He does not use inhalers and has no known allergies to medications. His blood pressure has been stable at home, and he monitors his sugar intake regularly.      Past  Medical History:  Diagnosis Date   Arthritis    Atherosclerosis of aorta    Avascular necrosis of bone of right hip (HCC) 03/01/2021   Avascular necrosis of bones of both hips (HCC) 12/11/2018   BPPV (benign paroxysmal positional vertigo) 09/26/2021   CAD (coronary artery disease)    Erectile dysfunction    High blood pressure    High cholesterol    Osteoarthritis of lumbar spine with myelopathy 03/01/2021   Other acute pancreatitis without necrosis or infection    Prediabetes     Past Surgical History:  Procedure Laterality Date   HERNIA REPAIR     TRANSFORAMINAL LUMBAR INTERBODY FUSION (TLIF) WITH PEDICLE SCREW FIXATION 2 LEVEL Right 05/25/2021   Procedure: RIGHT-SIDED LUMBAR 3- LUMBAR 4, LUMBAR 4- LUMBAR 5 TRANSFORAMINAL LUMBAR INTERBODY FUSION AND DECOMPRESSION WITH INSTRUMENTATION AND ALLOGRAFT;  Surgeon: Beuford Anes, MD;  Location: [facility] OR;  Service: Orthopedics;  Laterality: Right;    Family History  Problem Relation Age of Onset   Cancer Mother    Heart failure Father    Kidney Stones Father    Hypertension Other    Cancer Other     Social History   Socioeconomic History   Marital status: Married    Spouse name: Jori   Number of children: 1   Years of education: Not on file   Highest education level: Associate degree: occupational, scientist, product/process development, or vocational program  Occupational History   Occupation: Financial Controller  Employer: [employer]  Tobacco Use   Smoking status: Former    Current packs/day: 0.00    Types: Cigarettes    Quit date: 1980    Years since quitting: 46.0   Smokeless tobacco: Never  Vaping Use   Vaping status: Never Used  Substance and Sexual Activity   Alcohol use: Not Currently    Alcohol/week: 2.0 standard drinks of alcohol    Types: 1 Glasses of wine, 1 Cans of beer per week   Drug use: Never   Sexual activity: Not Currently  Other Topics Concern   Not on file  Social History Narrative   Not on file   Social Drivers of  Health   Tobacco Use: Medium Risk (01/08/2024)   Patient History    Smoking Tobacco Use: Former    Smokeless Tobacco Use: Never    Passive Exposure: Not on Actuary Strain: Low Risk (01/08/2024)   Overall Financial Resource Strain (CARDIA)    Difficulty of Paying Living Expenses: Not hard at all  Food Insecurity: No Food Insecurity (01/08/2024)   Epic    Worried About Radiation Protection Practitioner of Food in the Last Year: Never true    Ran Out of Food in the Last Year: Never true  Transportation Needs: No Transportation Needs (01/08/2024)   Epic    Lack of Transportation (Medical): No    Lack of Transportation (Non-Medical): No  Physical Activity: Sufficiently Active (01/08/2024)   Exercise Vital Sign    Days of Exercise per Week: 5 days    Minutes of Exercise per Session: 60 min  Stress: No Stress Concern Present (01/08/2024)   [employer] of Occupational Health - Occupational Stress Questionnaire    Feeling of Stress: Not at all  Social Connections: Moderately Isolated (01/08/2024)   Social Connection and Isolation Panel    Frequency of Communication with Friends and Family: Three times a week    Frequency of Social Gatherings with Friends and Family: Three times a week    Attends Religious Services: Never    Active Member of Clubs or Organizations: No    Attends Banker Meetings: Not on file    Marital Status: Married  Intimate Partner Violence: Not At Risk (10/29/2023)   Epic    Fear of Current or Ex-Partner: No    Emotionally Abused: No    Physically Abused: No    Sexually Abused: No  Depression (PHQ2-9): Low Risk (10/25/2023)   Depression (PHQ2-9)    PHQ-2 Score: 0  Alcohol Screen: Low Risk (10/29/2023)   Alcohol Screen    Last Alcohol Screening Score (AUDIT): 0  Housing: Low Risk (01/08/2024)   Epic    Unable to Pay for Housing in the Last Year: No    Number of Times Moved in the Last Year: 0    Homeless in the Last Year: No  Utilities: Not At Risk  (10/29/2023)   Epic    Threatened with loss of utilities: No  Health Literacy: Adequate Health Literacy (10/29/2023)   B1300 Health Literacy    Frequency of need for help with medical instructions: Never    Outpatient Medications Prior to Visit  Medication Sig Dispense Refill   ALPRAZolam  (XANAX ) 1 MG tablet Take 1.5 tablets (1.5 mg total) by mouth 2 (two) times daily as needed for anxiety. 45 tablet 5   amLODipine  (NORVASC ) 2.5 MG tablet TAKE 1 TABLET EVERY DAY 90 tablet 3   Ascorbic Acid  (VITAMIN C) 1000 MG tablet Take 1,000 mg by  mouth daily.     aspirin EC 81 MG tablet Take 81 mg by mouth daily.     Calcium-Magnesium-Vitamin D (CALCIUM 1200+D3 PO) Take 1 tablet by mouth daily.     simvastatin  (ZOCOR ) 10 MG tablet TAKE 1 TABLET AT BEDTIME 90 tablet 3   valsartan -hydrochlorothiazide  (DIOVAN -HCT) 320-25 MG tablet TAKE 1 TABLET EVERY DAY 90 tablet 3   No facility-administered medications prior to visit.    Allergies[1]  Review of Systems  Constitutional:  Negative for appetite change, fatigue and fever.  HENT:  Positive for congestion. Negative for ear pain, sinus pressure and sore throat.   Respiratory:  Positive for cough. Negative for chest tightness, shortness of breath and wheezing.   Cardiovascular:  Negative for chest pain and palpitations.  Gastrointestinal:  Negative for abdominal pain, constipation, diarrhea, nausea and vomiting.  Genitourinary:  Negative for dysuria and hematuria.  Musculoskeletal:  Negative for arthralgias, back pain, joint swelling and myalgias.  Skin:  Negative for rash.  Neurological:  Negative for dizziness, weakness and headaches.  Psychiatric/Behavioral:  Negative for dysphoric mood. The patient is not nervous/anxious.        Objective:        01/08/2024   10:52 AM 10/29/2023   10:04 AM 10/25/2023    7:36 AM  Vitals with BMI  Height 5' 5.8 5' 5.8 5' 8.5  Weight 193 lbs 195 lbs 195 lbs 10 oz  BMI 31.35 31.68 29.31  Systolic 138  132   Diastolic 72  74  Pulse 68  60    Orthostatic VS for the past 72 hrs (Last 3 readings):  Patient Position BP Location  01/08/24 1052 Sitting Left Arm     Physical Exam Vitals reviewed.  Constitutional:      Appearance: Normal appearance.  Eyes:     General:        Right eye: Discharge present. No hordeolum.        Left eye: No discharge or hordeolum.     Conjunctiva/sclera:     Right eye: Right conjunctiva is injected.     Left eye: Left conjunctiva is not injected.  Neck:     Vascular: No carotid bruit.  Cardiovascular:     Rate and Rhythm: Normal rate and regular rhythm.     Heart sounds: Normal heart sounds.  Pulmonary:     Effort: Pulmonary effort is normal.     Breath sounds: Normal breath sounds.  Abdominal:     General: Bowel sounds are normal.     Palpations: Abdomen is soft.     Tenderness: There is no abdominal tenderness.  Neurological:     Mental Status: He is alert and oriented to person, place, and time.  Psychiatric:        Mood and Affect: Mood normal.        Behavior: Behavior normal.     There are no preventive care reminders to display for this patient.  There are no preventive care reminders to display for this patient.   Lab Results  Component Value Date   TSH 2.650 08/09/2021   Lab Results  Component Value Date   WBC 5.3 04/26/2023   HGB 15.5 04/26/2023   HCT 45.6 04/26/2023   MCV 90 04/26/2023   PLT 201 04/26/2023   Lab Results  Component Value Date   NA 143 04/26/2023   K 4.1 04/26/2023   CO2 23 04/26/2023   GLUCOSE 103 (H) 04/26/2023   BUN 15 04/26/2023   CREATININE 0.83  04/26/2023   BILITOT 0.9 04/26/2023   ALKPHOS 102 04/26/2023   AST 29 04/26/2023   ALT 35 04/26/2023   PROT 6.4 04/26/2023   ALBUMIN  4.4 04/26/2023   CALCIUM 9.4 04/26/2023   ANIONGAP 6 05/18/2021   EGFR 91 04/26/2023   Lab Results  Component Value Date   CHOL 149 04/26/2023   Lab Results  Component Value Date   HDL 50 04/26/2023   Lab  Results  Component Value Date   LDLCALC 83 04/26/2023   Lab Results  Component Value Date   TRIG 87 04/26/2023   Lab Results  Component Value Date   CHOLHDL 3.0 04/26/2023   Lab Results  Component Value Date   HGBA1C 5.9 10/25/2023        Results for orders placed or performed in visit on 10/25/23  POCT Lipid Panel   Collection Time: 10/25/23  7:55 AM  Result Value Ref Range   TC 142    HDL 46    TRG 80    LDL 80    Non-HDL 96    TC/HDL    POCT glycosylated hemoglobin (Hb A1C)   Collection Time: 10/25/23  7:56 AM  Result Value Ref Range   Hemoglobin A1C     HbA1c POC (<> result, manual entry) 5.9 4.0 - 5.6 %   HbA1c, POC (prediabetic range)     HbA1c, POC (controlled diabetic range)       Assessment & Plan:   Assessment & Plan Acute laryngopharyngitis Acute laryngopharyngitis Persistent cough and sinus congestion with clear sputum, no significant fever. Previous treatments ineffective. Likely sinus-related infection given symptoms and absence of reflux indicators. - Prescribed doxycycline  for 5 days. - Prescribed prednisone  taper if no improvement with antibiotic and cough syrup. - Prescribed cough syrup for symptomatic relief. - Advised use of hard candies or lozenges to keep throat moist. - Instructed to monitor symptoms and report if no improvement. Orders:   predniSONE  (DELTASONE ) 20 MG tablet; Take 3 tablets (60 mg total) by mouth daily with breakfast for 3 days, THEN 2 tablets (40 mg total) daily with breakfast for 3 days, THEN 1 tablet (20 mg total) daily with breakfast for 3 days.   guaiFENesin -codeine  100-10 MG/5ML syrup; Take 5 mLs by mouth 3 (three) times daily as needed for cough.   doxycycline  (VIBRA -TABS) 100 MG tablet; Take 1 tablet (100 mg total) by mouth 2 (two) times daily.     Body mass index is 31.34 kg/m.SABRA    No orders of the defined types were placed in this encounter.   No orders of the defined types were placed in this  encounter.    Follow-up: No follow-ups on file.  An After Visit Summary was printed and given to the patient.   I,Lauren M Auman,acting as a neurosurgeon for [employer], [employer].,have documented all relevant documentation on the behalf of Nola Angles, PA,as directed by  Nola Angles, PA while in the presence of Nola Angles, [place].    Nola Angles, [place] [facility] [phone]     [1] No Known Allergies  "

## 2024-01-17 ENCOUNTER — Telehealth: Payer: Self-pay

## 2024-01-17 NOTE — Telephone Encounter (Signed)
 Copied from CRM #[id number]. Topic: Clinical - Medical Advice >> Jan 17, 2024  9:33 AM Montie POUR wrote: Reason for CRM:  He came in on 01/08/24 for Acute laryngopharyngitis. His cough is not going away. It is not worse but no better. Please call to let him know if he can get more medication. He taken everything. His number is [phone].  Notes in Chart dated 01/08/24: Orders:   predniSONE  (DELTASONE ) 20 MG tablet; Take 3 tablets (60 mg total) by mouth daily with breakfast for 3 days, THEN 2 tablets (40 mg total) daily with breakfast for 3 days, THEN 1 tablet (20 mg total) daily with breakfast for 3 days.   guaiFENesin -codeine  100-10 MG/5ML syrup; Take 5 mLs by mouth 3 (three) times daily as needed for cough.   doxycycline  (VIBRA -TABS) 100 MG tablet; Take 1 tablet (100 mg total) by mouth 2 (two) times daily.

## 2024-01-18 ENCOUNTER — Ambulatory Visit (HOSPITAL_BASED_OUTPATIENT_CLINIC_OR_DEPARTMENT_OTHER)
Admission: RE | Admit: 2024-01-18 | Discharge: 2024-01-18 | Disposition: A | Discharge status: Home / Self Care | Source: Ambulatory Visit | Attending: Physician Assistant | Admitting: Physician Assistant

## 2024-01-18 ENCOUNTER — Other Ambulatory Visit: Payer: Self-pay | Admitting: Physician Assistant

## 2024-01-18 ENCOUNTER — Ambulatory Visit: Payer: Self-pay | Admitting: Physician Assistant

## 2024-01-18 ENCOUNTER — Encounter: Payer: Self-pay | Admitting: Physician Assistant

## 2024-01-18 ENCOUNTER — Ambulatory Visit: Admitting: Physician Assistant

## 2024-01-18 VITALS — BP 120/68 | HR 76 | Temp 97.3°F | Resp 18 | Ht 65.0 in | Wt 195.8 lb

## 2024-01-18 DIAGNOSIS — R053 Chronic cough: Secondary | ICD-10-CM | POA: Diagnosis not present

## 2024-01-18 DIAGNOSIS — B948 Sequelae of other specified infectious and parasitic diseases: Secondary | ICD-10-CM

## 2024-01-18 DIAGNOSIS — J9801 Acute bronchospasm: Secondary | ICD-10-CM | POA: Diagnosis not present

## 2024-01-18 DIAGNOSIS — R918 Other nonspecific abnormal finding of lung field: Secondary | ICD-10-CM

## 2024-01-18 MED ORDER — HYDROCODONE BIT-HOMATROP MBR 5-1.5 MG/5ML PO SOLN: 5.0000 mL | Freq: Four times a day (QID) | ORAL | 0 refills | Status: DC | PRN

## 2024-01-18 MED ORDER — BREZTRI AEROSPHERE 160-9-4.8 MCG/ACT IN AERO: 2.0000 | INHALATION_SPRAY | Freq: Two times a day (BID) | RESPIRATORY_TRACT | Status: DC

## 2024-01-18 MED ORDER — MONTELUKAST SODIUM 10 MG PO TABS: 10.0000 mg | ORAL_TABLET | Freq: Every day | ORAL | 0 refills | Status: AC

## 2024-01-18 NOTE — Progress Notes (Signed)
 "  Acute Office Visit  Subjective:    Patient ID: Austin Atkins, male    DOB: June 25, 1945, 79 y.o.   MRN: [id number]  Chief Complaint  Patient presents with   Cough    HPI: Patient is in today for complaints of persistent cough for the past month.  He states at times it is productive but other times not.  He has been seen in urgent care and here in our office for similar symptoms - he has been treated with kenalog  injection, prednisone  tablets, zpack and doxycycline .  He states he has not had chest pain or dyspnea Does occasionally smoke cigars Denies sinus congestion or fever  Current Medications[1]  Allergies[2]  ROS CONSTITUTIONAL: Negative for chills, fatigue, fever, unintentional weight gain and unintentional weight loss.  E/N/T: Negative for ear pain, nasal congestion and sore throat.  CARDIOVASCULAR: Negative for chest pain,  RESPIRATORY: see HPI      Objective:    PHYSICAL EXAM:   BP 120/68   Pulse 76   Temp (!) 97.3 F (36.3 C) (Temporal)   Resp 18   Ht 5' 5 (1.651 m)   Wt 195 lb 12.8 oz (88.8 kg)   SpO2 100%   BMI 32.58 kg/m    GEN: Well nourished, well developed, in no acute distress  HEENT: normal external ears and nose - normal external auditory canals and TMS -  Lips, Teeth and Gums - normal  Oropharynx - normal mucosa, palate, and posterior pharynx Cardiac: RRR; no murmurs, rubs, or gallops,no edema - Respiratory:  normal respiratory rate and pattern with no distress - rare expiratory wheeze noted Skin: warm and dry, no rash  Psych: euthymic mood, appropriate affect and demeanor       Assessment & Plan Persistent cough after viral respiratory infection  Orders:   budesonide-glycopyrrolate-formoterol (BREZTRI  AEROSPHERE) 160-9-4.8 MCG/ACT AERO inhaler; Inhale 2 puffs into the lungs 2 (two) times daily.   HYDROcodone  bit-homatropine (HYDROMET) 5-1.5 MG/5ML syrup; Take 5 mLs by mouth every 6 (six) hours as needed.   montelukast  (SINGULAIR ) 10  MG tablet; Take 1 tablet (10 mg total) by mouth at bedtime.   DG Chest 2 View; Future  Bronchospasm  Orders:   DG Chest 2 View; Future    Follow-up: Return if symptoms worsen or fail to improve.  An After Visit Summary was printed and given to the patient.  SARA R Pranay Hilbun, PA-C [facility] [phone]     [1]  Current Outpatient Medications:    ALPRAZolam  (XANAX ) 1 MG tablet, Take 1.5 tablets (1.5 mg total) by mouth 2 (two) times daily as needed for anxiety., Disp: 45 tablet, Rfl: 5   amLODipine  (NORVASC ) 2.5 MG tablet, TAKE 1 TABLET EVERY DAY, Disp: 90 tablet, Rfl: 3   Ascorbic Acid  (VITAMIN C) 1000 MG tablet, Take 1,000 mg by mouth daily., Disp: , Rfl:    aspirin EC 81 MG tablet, Take 81 mg by mouth daily., Disp: , Rfl:    budesonide-glycopyrrolate-formoterol (BREZTRI  AEROSPHERE) 160-9-4.8 MCG/ACT AERO inhaler, Inhale 2 puffs into the lungs 2 (two) times daily., Disp: , Rfl:    Calcium-Magnesium-Vitamin D (CALCIUM 1200+D3 PO), Take 1 tablet by mouth daily., Disp: , Rfl:    HYDROcodone  bit-homatropine (HYDROMET) 5-1.5 MG/5ML syrup, Take 5 mLs by mouth every 6 (six) hours as needed., Disp: 120 mL, Rfl: 0   montelukast  (SINGULAIR ) 10 MG tablet, Take 1 tablet (10 mg total) by mouth at bedtime., Disp: 30 tablet, Rfl: 0   simvastatin  (ZOCOR )  10 MG tablet, TAKE 1 TABLET AT BEDTIME, Disp: 90 tablet, Rfl: 3   valsartan -hydrochlorothiazide  (DIOVAN -HCT) 320-25 MG tablet, TAKE 1 TABLET EVERY DAY, Disp: 90 tablet, Rfl: 3 [2] No Known Allergies  "

## 2024-01-18 NOTE — Assessment & Plan Note (Signed)
" °  Orders:   budesonide-glycopyrrolate-formoterol (BREZTRI  AEROSPHERE) 160-9-4.8 MCG/ACT AERO inhaler; Inhale 2 puffs into the lungs 2 (two) times daily.   HYDROcodone  bit-homatropine (HYDROMET) 5-1.5 MG/5ML syrup; Take 5 mLs by mouth every 6 (six) hours as needed.   montelukast  (SINGULAIR ) 10 MG tablet; Take 1 tablet (10 mg total) by mouth at bedtime.   DG Chest 2 View; Future  "

## 2024-01-18 NOTE — Assessment & Plan Note (Signed)
  Orders:   DG Chest 2 View; Future

## 2024-01-25 ENCOUNTER — Telehealth: Payer: Self-pay | Admitting: Family Medicine

## 2024-01-25 ENCOUNTER — Ambulatory Visit (HOSPITAL_BASED_OUTPATIENT_CLINIC_OR_DEPARTMENT_OTHER)
Admission: RE | Admit: 2024-01-25 | Discharge: 2024-01-25 | Disposition: A | Source: Ambulatory Visit | Attending: Physician Assistant | Admitting: Physician Assistant

## 2024-01-25 DIAGNOSIS — R918 Other nonspecific abnormal finding of lung field: Secondary | ICD-10-CM

## 2024-01-25 NOTE — Telephone Encounter (Signed)
Spoke with patient, verbalized understanding and had no questions at this time.  

## 2024-01-25 NOTE — Telephone Encounter (Signed)
 Copied from CRM #[id number]. Topic: Referral - Status >> Jan 24, 2024 11:23 AM Olam RAMAN wrote: Reason for CRM: pt calling to check on ct auth requirment from ins. advise referal shows no auth required. Pt would like ct scheduled. Cb [phone]

## 2024-01-25 NOTE — Telephone Encounter (Signed)
 Patient is scheduled today 1:30 at [facility].

## 2024-01-29 ENCOUNTER — Ambulatory Visit: Admitting: Family Medicine

## 2024-01-29 ENCOUNTER — Encounter: Payer: Self-pay | Admitting: Family Medicine

## 2024-01-29 VITALS — BP 122/72 | HR 98 | Temp 97.8°F | Ht 65.0 in | Wt 190.0 lb

## 2024-01-29 DIAGNOSIS — R052 Subacute cough: Secondary | ICD-10-CM

## 2024-01-29 MED ORDER — OMEPRAZOLE 20 MG PO CPDR: 20.0000 mg | DELAYED_RELEASE_CAPSULE | Freq: Every day | ORAL | 1 refills | Status: AC

## 2024-01-29 MED ORDER — BENZONATATE 200 MG PO CAPS: 200.0000 mg | ORAL_CAPSULE | Freq: Three times a day (TID) | ORAL | 0 refills | Status: AC | PRN

## 2024-01-29 NOTE — Progress Notes (Signed)
 "  Acute Office Visit  Subjective:    Patient ID: Austin Atkins, male    DOB: 1945/10/30, 79 y.o.   MRN: [id number]  Chief Complaint  Patient presents with   Cough    Started Christmas Eve has seen a provider 5 times. 3 times given axbx. Has had a chest xray and CT. Cough is worse at night. Sometimes productive (clear)    Discussed the use of AI scribe software for clinical note transcription with the patient, who gave verbal consent to proceed.  History of Present Illness Austin Atkins is a 79 year old male who presents with a persistent cough.  Cough - Persistent cough began on Christmas Eve, initially accompanied by sore throat and sinus drainage. - Cough originates deep in the chest and is particularly bothersome at night when lying flat. - Cough has not improved despite multiple treatments and is impacting ability to get restful sleep. - No recent COVID-19 infection. - Very seldom experiences acid reflux. - Has not tried over-the-counter cough medicines or Tessalon  Perles. - Frustrated with ongoing nature of cough, stating it is 'wearing me out'.  Prior treatments for cough - Received a shot at urgent care during initial visit; symptoms persisted. - Returned to urgent care a week later and was given medication; cough became more persistent. - Has tried antibiotics, inhaler, prednisone , Kenalog , promethazine with dextromethorphan, and guaifenesin  with codeine . - Cough syrups and other treatments have not provided relief.  Imaging and diagnostic studies - Underwent chest x-ray and CT scan. - Received results and showed some cholesterol buildup but otherwise results were normal  Hyperlipidemia - Takes medication for cholesterol management.    Past Medical History:  Diagnosis Date   Arthritis    Atherosclerosis of aorta    Avascular necrosis of bone of right hip (HCC) 03/01/2021   Avascular necrosis of bones of both hips (HCC) 12/11/2018   BPPV (benign paroxysmal  positional vertigo) 09/26/2021   CAD (coronary artery disease)    Erectile dysfunction    High blood pressure    High cholesterol    Osteoarthritis of lumbar spine with myelopathy 03/01/2021   Other acute pancreatitis without necrosis or infection    Prediabetes     Past Surgical History:  Procedure Laterality Date   HERNIA REPAIR     TRANSFORAMINAL LUMBAR INTERBODY FUSION (TLIF) WITH PEDICLE SCREW FIXATION 2 LEVEL Right 05/25/2021   Procedure: RIGHT-SIDED LUMBAR 3- LUMBAR 4, LUMBAR 4- LUMBAR 5 TRANSFORAMINAL LUMBAR INTERBODY FUSION AND DECOMPRESSION WITH INSTRUMENTATION AND ALLOGRAFT;  Surgeon: Beuford Anes, MD;  Location: [facility] OR;  Service: Orthopedics;  Laterality: Right;    Family History  Problem Relation Age of Onset   Cancer Mother    Heart failure Father    Kidney Stones Father    Hypertension Other    Cancer Other     Social History   Socioeconomic History   Marital status: Married    Spouse name: Jori   Number of children: 1   Years of education: Not on file   Highest education level: Associate degree: occupational, scientist, product/process development, or vocational program  Occupational History   Occupation: Magazine Features Editor: [employer]  Tobacco Use   Smoking status: Former    Current packs/day: 0.00    Types: Cigarettes    Quit date: 1980    Years since quitting: 46.1   Smokeless tobacco: Never  Vaping Use   Vaping status: Never Used  Substance and Sexual Activity   Alcohol use:  Not Currently    Alcohol/week: 2.0 standard drinks of alcohol    Types: 1 Glasses of wine, 1 Cans of beer per week   Drug use: Never   Sexual activity: Not Currently  Other Topics Concern   Not on file  Social History Narrative   Not on file   Social Drivers of Health   Tobacco Use: Medium Risk (01/29/2024)   Patient History    Smoking Tobacco Use: Former    Smokeless Tobacco Use: Never    Passive Exposure: Not on file  Financial Resource Strain: Low Risk (01/08/2024)   Overall  Financial Resource Strain (CARDIA)    Difficulty of Paying Living Expenses: Not hard at all  Food Insecurity: No Food Insecurity (01/08/2024)   Epic    Worried About Radiation Protection Practitioner of Food in the Last Year: Never true    Ran Out of Food in the Last Year: Never true  Transportation Needs: No Transportation Needs (01/08/2024)   Epic    Lack of Transportation (Medical): No    Lack of Transportation (Non-Medical): No  Physical Activity: Sufficiently Active (01/08/2024)   Exercise Vital Sign    Days of Exercise per Week: 5 days    Minutes of Exercise per Session: 60 min  Stress: No Stress Concern Present (01/08/2024)   [employer] of Occupational Health - Occupational Stress Questionnaire    Feeling of Stress: Not at all  Social Connections: Moderately Isolated (01/08/2024)   Social Connection and Isolation Panel    Frequency of Communication with Friends and Family: Three times a week    Frequency of Social Gatherings with Friends and Family: Three times a week    Attends Religious Services: Never    Active Member of Clubs or Organizations: No    Attends Banker Meetings: Not on file    Marital Status: Married  Intimate Partner Violence: Not At Risk (10/29/2023)   Epic    Fear of Current or Ex-Partner: No    Emotionally Abused: No    Physically Abused: No    Sexually Abused: No  Depression (PHQ2-9): Low Risk (10/25/2023)   Depression (PHQ2-9)    PHQ-2 Score: 0  Alcohol Screen: Low Risk (10/29/2023)   Alcohol Screen    Last Alcohol Screening Score (AUDIT): 0  Housing: Low Risk (01/08/2024)   Epic    Unable to Pay for Housing in the Last Year: No    Number of Times Moved in the Last Year: 0    Homeless in the Last Year: No  Utilities: Not At Risk (10/29/2023)   Epic    Threatened with loss of utilities: No  Health Literacy: Adequate Health Literacy (10/29/2023)   B1300 Health Literacy    Frequency of need for help with medical instructions: Never    Outpatient  Medications Prior to Visit  Medication Sig Dispense Refill   ALPRAZolam  (XANAX ) 1 MG tablet Take 1.5 tablets (1.5 mg total) by mouth 2 (two) times daily as needed for anxiety. 45 tablet 5   amLODipine  (NORVASC ) 2.5 MG tablet TAKE 1 TABLET EVERY DAY 90 tablet 3   Ascorbic Acid  (VITAMIN C) 1000 MG tablet Take 1,000 mg by mouth daily.     aspirin EC 81 MG tablet Take 81 mg by mouth daily.     Calcium-Magnesium-Vitamin D (CALCIUM 1200+D3 PO) Take 1 tablet by mouth daily.     montelukast  (SINGULAIR ) 10 MG tablet Take 1 tablet (10 mg total) by mouth at bedtime. 30 tablet 0   simvastatin  (  ZOCOR ) 10 MG tablet TAKE 1 TABLET AT BEDTIME 90 tablet 3   valsartan -hydrochlorothiazide  (DIOVAN -HCT) 320-25 MG tablet TAKE 1 TABLET EVERY DAY 90 tablet 3   budesonide-glycopyrrolate-formoterol (BREZTRI  AEROSPHERE) 160-9-4.8 MCG/ACT AERO inhaler Inhale 2 puffs into the lungs 2 (two) times daily.     HYDROcodone  bit-homatropine (HYDROMET) 5-1.5 MG/5ML syrup Take 5 mLs by mouth every 6 (six) hours as needed. 120 mL 0   No facility-administered medications prior to visit.    Allergies[1]  Review of Systems  Constitutional:  Negative for chills and fever.  HENT:  Negative for congestion, ear pain and sore throat.   Respiratory:  Positive for cough. Negative for shortness of breath.   Cardiovascular:  Negative for chest pain.  Gastrointestinal:  Negative for abdominal pain, constipation, nausea and vomiting.       Denies reflux.        Objective:        01/29/2024    3:52 PM 01/18/2024   10:22 AM 01/08/2024   10:52 AM  Vitals with BMI  Height 5' 5 5' 5 5' 5.8  Weight 190 lbs 195 lbs 13 oz 193 lbs  BMI 31.62 32.58 31.35  Systolic 122 120 861  Diastolic 72 68 72  Pulse 98 76 68    No data found.   Physical Exam Constitutional:      Appearance: Normal appearance.  HENT:     Right Ear: Tympanic membrane, ear canal and external ear normal.     Left Ear: Tympanic membrane, ear canal and external ear  normal.     Nose: Nose normal. No congestion or rhinorrhea.     Mouth/Throat:     Mouth: Mucous membranes are moist.     Pharynx: No oropharyngeal exudate or posterior oropharyngeal erythema.  Cardiovascular:     Rate and Rhythm: Normal rate and regular rhythm.     Heart sounds: Normal heart sounds.  Pulmonary:     Effort: Pulmonary effort is normal. No respiratory distress.     Breath sounds: Normal breath sounds. No wheezing, rhonchi or rales.  Lymphadenopathy:     Cervical: No cervical adenopathy.  Neurological:     Mental Status: He is alert.  Psychiatric:        Mood and Affect: Mood normal.        Behavior: Behavior normal.     There are no preventive care reminders to display for this patient.  There are no preventive care reminders to display for this patient.   Lab Results  Component Value Date   TSH 2.650 08/09/2021   Lab Results  Component Value Date   WBC 5.3 04/26/2023   HGB 15.5 04/26/2023   HCT 45.6 04/26/2023   MCV 90 04/26/2023   PLT 201 04/26/2023   Lab Results  Component Value Date   NA 143 04/26/2023   K 4.1 04/26/2023   CO2 23 04/26/2023   GLUCOSE 103 (H) 04/26/2023   BUN 15 04/26/2023   CREATININE 0.83 04/26/2023   BILITOT 0.9 04/26/2023   ALKPHOS 102 04/26/2023   AST 29 04/26/2023   ALT 35 04/26/2023   PROT 6.4 04/26/2023   ALBUMIN  4.4 04/26/2023   CALCIUM 9.4 04/26/2023   ANIONGAP 6 05/18/2021   EGFR 91 04/26/2023   Lab Results  Component Value Date   CHOL 149 04/26/2023   Lab Results  Component Value Date   HDL 50 04/26/2023   Lab Results  Component Value Date   LDLCALC 83 04/26/2023   Lab Results  Component Value Date   TRIG 87 04/26/2023   Lab Results  Component Value Date   CHOLHDL 3.0 04/26/2023   Lab Results  Component Value Date   HGBA1C 5.9 10/25/2023        Results for orders placed or performed in visit on 10/25/23  POCT Lipid Panel   Collection Time: 10/25/23  7:55 AM  Result Value Ref Range   TC  142    HDL 46    TRG 80    LDL 80    Non-HDL 96    TC/HDL    POCT glycosylated hemoglobin (Hb A1C)   Collection Time: 10/25/23  7:56 AM  Result Value Ref Range   Hemoglobin A1C     HbA1c POC (<> result, manual entry) 5.9 4.0 - 5.6 %   HbA1c, POC (prediabetic range)     HbA1c, POC (controlled diabetic range)       Assessment & Plan:   Assessment & Plan Subacute cough Persisting cough with no significant findings on imaging, likely due to bronchial irritation. LPR is also a possibility. Previous treatments ineffective. No COVID-19 or significant acid reflux. Postnasal drainage resolved. - Prescribed Tessalon  Perles for cough management. - Provided chronic cough management cheat sheet. - Trial on omeprazole  20 mg daily.        Body mass index is 31.62 kg/m.. Meds ordered this encounter  Medications   benzonatate  (TESSALON ) 200 MG capsule    Sig: Take 1 capsule (200 mg total) by mouth 3 (three) times daily as needed for cough.    Dispense:  30 capsule    Refill:  0   omeprazole  (PRILOSEC) 20 MG capsule    Sig: Take 1 capsule (20 mg total) by mouth at bedtime.    Dispense:  30 capsule    Refill:  1    No orders of the defined types were placed in this encounter.    Follow-up: Return if symptoms worsen or fail to improve.  An After Visit Summary was printed and given to the patient.  Abigail Free, MD [facility] [phone]     [1] No Known Allergies  "

## 2024-01-30 ENCOUNTER — Ambulatory Visit: Payer: Self-pay | Admitting: Family Medicine

## 2024-02-04 ENCOUNTER — Ambulatory Visit: Payer: Self-pay

## 2024-02-04 NOTE — Telephone Encounter (Signed)
 FYI Only or Action Required?: Action required by provider: update on patient condition and next steps.  Patient was last seen in primary care on 01/29/2024 by Sherre Clapper, MD.  Called Nurse Triage reporting Cough.  Symptoms began several months ago.  Interventions attempted: Prescription medications: Tessalon , omeprazole  and Rest, hydration, or home remedies.  Symptoms are: gradually worsening.  Triage Disposition: See Physician Within 24 Hours  Patient/caregiver understands and will follow disposition?: Yes  Copied from CRM #[id number]. Topic: Clinical - Medical Advice >> Feb 04, 2024 10:16 AM Kevelyn M wrote: Reason for CRM: Patient calling in because he still is fighting a cough while taking prescribed medication. He's asking what's next or if there is something else he needs to take?  Call back # [phone] Reason for Disposition  SEVERE coughing spells (e.g., whooping sound after coughing, vomiting after coughing)  Answer Assessment - Initial Assessment Questions Chronic cough present since Christmas Eve. Has been seen 3x with no real relief of symptoms. Was advised to CB to Dr Sherre to make her aware if latest treatment is ineffective.   Tessalon  Perles- no benefit Omeprazole - no benefit   Coughing fits worse at night-does get dizzy if tries to stand immediately after coughing. Otherwise states he feels good. Clear phlegm when productive  Please advise on next steps   1. ONSET: When did the cough begin?      12/24 2. SEVERITY: How bad is the cough today?      Worse at night- coughing fits- can cause dizzy 3. SPUTUM: Describe the color of your sputum (e.g., none, dry cough; clear, white, yellow, green)     Clear white 4. HEMOPTYSIS: Are you coughing up any blood? If so ask: How much? (e.g., flecks, streaks, tablespoons, etc.)     Denies  5. DIFFICULTY BREATHING: Are you having difficulty breathing? If Yes, ask: How bad is it? (e.g., mild, moderate, severe)       denies 6. FEVER: Do you have a fever? If Yes, ask: What is your temperature, how was it measured, and when did it start?     denies 7. CARDIAC HISTORY: Do you have any history of heart disease? (e.g., heart attack, congestive heart failure)      HTN  8. LUNG HISTORY: Do you have any history of lung disease?  (e.g., pulmonary embolus, asthma, emphysema)     Persistent cough 9. PE RISK FACTORS: Do you have a history of blood clots? (or: recent major surgery, recent prolonged travel, bedridden)     Denies  10. OTHER SYMPTOMS: Do you have any other symptoms? (e.g., runny nose, wheezing, chest pain)       Dizzy if standing after coughing fit. Hard to sleep great  Protocols used: Cough - Chronic-A-AH

## 2024-04-24 ENCOUNTER — Ambulatory Visit: Admitting: Family Medicine

## 2024-10-30 ENCOUNTER — Ambulatory Visit: Admitting: Family Medicine
